# Patient Record
Sex: Female | Born: 1985 | Race: Black or African American | Hispanic: No | Marital: Single | Smoking: Never smoker
Health system: Southern US, Community
[De-identification: ages and names within clinical notes are randomized; demographics above are authoritative.]

## PROBLEM LIST (undated history)

## (undated) ENCOUNTER — Emergency Department (HOSPITAL_COMMUNITY): Payer: Self-pay | Source: Home / Self Care

## (undated) ENCOUNTER — Emergency Department (HOSPITAL_COMMUNITY): Discharge status: Absent without leave | Payer: Self-pay

## (undated) DIAGNOSIS — L309 Dermatitis, unspecified: Secondary | ICD-10-CM

---
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
## 2007-05-29 ENCOUNTER — Ambulatory Visit: Payer: Self-pay | Admitting: Internal Medicine

## 2007-05-29 ENCOUNTER — Encounter (INDEPENDENT_AMBULATORY_CARE_PROVIDER_SITE_OTHER): Payer: Self-pay | Admitting: Nurse Practitioner

## 2007-05-29 DIAGNOSIS — N946 Dysmenorrhea, unspecified: Secondary | ICD-10-CM | POA: Insufficient documentation

## 2007-05-29 DIAGNOSIS — F909 Attention-deficit hyperactivity disorder, unspecified type: Secondary | ICD-10-CM | POA: Insufficient documentation

## 2007-05-29 DIAGNOSIS — F410 Panic disorder [episodic paroxysmal anxiety] without agoraphobia: Secondary | ICD-10-CM | POA: Insufficient documentation

## 2007-07-10 ENCOUNTER — Encounter (INDEPENDENT_AMBULATORY_CARE_PROVIDER_SITE_OTHER): Payer: Self-pay | Admitting: Nurse Practitioner

## 2007-11-01 ENCOUNTER — Emergency Department (HOSPITAL_COMMUNITY): Admission: EM | Admit: 2007-11-01 | Discharge: 2007-11-02 | Payer: Self-pay | Admitting: Emergency Medicine

## 2007-11-04 ENCOUNTER — Ambulatory Visit: Payer: Self-pay | Admitting: Nurse Practitioner

## 2008-01-14 ENCOUNTER — Ambulatory Visit: Payer: Self-pay | Admitting: Nurse Practitioner

## 2008-01-14 LAB — CONVERTED CEMR LAB
ALT: <8 U/L
AST: 15 U/L
Albumin: 4.5 g/dL
Alkaline Phosphatase: 49 U/L
BUN: 8 mg/dL
Basophils Absolute: 0 K/uL
Basophils Relative: 0 %
CO2: 25 meq/L
Calcium: 10 mg/dL
Chloride: 106 meq/L
Creatinine, Ser: 0.79 mg/dL
Eosinophils Absolute: 0.2 K/uL
Eosinophils Relative: 2 %
Glucose, Bld: 83 mg/dL
HCT: 40.5 %
Hemoglobin: 13.4 g/dL
Lymphocytes Relative: 24 %
Lymphs Abs: 2.1 K/uL
MCHC: 33.1 g/dL
MCV: 85.3 fL
Monocytes Absolute: 0.6 K/uL
Monocytes Relative: 7 %
Neutro Abs: 5.7 K/uL
Neutrophils Relative %: 66 %
Platelets: 229 K/uL
Potassium: 4.6 meq/L
RBC: 4.75 M/uL
RDW: 12.2 %
Sodium: 140 meq/L
TSH: 0.294 u[IU]/mL — ABNORMAL LOW
Total Bilirubin: 0.3 mg/dL
Total Protein: 7.4 g/dL
WBC: 8.6 10*3/microliter

## 2008-01-15 ENCOUNTER — Encounter (INDEPENDENT_AMBULATORY_CARE_PROVIDER_SITE_OTHER): Payer: Self-pay | Admitting: Nurse Practitioner

## 2008-01-18 ENCOUNTER — Encounter (INDEPENDENT_AMBULATORY_CARE_PROVIDER_SITE_OTHER): Payer: Self-pay | Admitting: Nurse Practitioner

## 2008-01-18 DIAGNOSIS — E0590 Thyrotoxicosis, unspecified without thyrotoxic crisis or storm: Secondary | ICD-10-CM | POA: Insufficient documentation

## 2008-01-18 LAB — CONVERTED CEMR LAB
Free T4: 1.08 ng/dL
T3, Free: 2.9 pg/mL

## 2008-01-28 ENCOUNTER — Ambulatory Visit: Payer: Self-pay | Admitting: Nurse Practitioner

## 2008-01-28 DIAGNOSIS — N3000 Acute cystitis without hematuria: Secondary | ICD-10-CM | POA: Insufficient documentation

## 2008-01-28 LAB — CONVERTED CEMR LAB
Blood in Urine, dipstick: NEGATIVE
Protein, U semiquant: NEGATIVE
Urobilinogen, UA: 1

## 2008-01-29 ENCOUNTER — Encounter (INDEPENDENT_AMBULATORY_CARE_PROVIDER_SITE_OTHER): Payer: Self-pay | Admitting: Nurse Practitioner

## 2008-02-01 ENCOUNTER — Ambulatory Visit: Payer: Self-pay | Admitting: *Deleted

## 2008-02-02 ENCOUNTER — Ambulatory Visit (HOSPITAL_COMMUNITY): Admission: RE | Admit: 2008-02-02 | Discharge: 2008-02-02 | Payer: Self-pay | Admitting: Internal Medicine

## 2008-02-29 ENCOUNTER — Encounter (INDEPENDENT_AMBULATORY_CARE_PROVIDER_SITE_OTHER): Payer: Self-pay | Admitting: Nurse Practitioner

## 2008-03-02 ENCOUNTER — Ambulatory Visit: Payer: Self-pay | Admitting: Nurse Practitioner

## 2008-03-02 DIAGNOSIS — E041 Nontoxic single thyroid nodule: Secondary | ICD-10-CM | POA: Insufficient documentation

## 2008-03-02 DIAGNOSIS — L259 Unspecified contact dermatitis, unspecified cause: Secondary | ICD-10-CM | POA: Insufficient documentation

## 2008-03-02 LAB — CONVERTED CEMR LAB
Bilirubin Urine: NEGATIVE
Blood in Urine, dipstick: NEGATIVE
Glucose, Urine, Semiquant: NEGATIVE
KOH Prep: NEGATIVE
Nitrite: NEGATIVE
Protein, U semiquant: 30
Urobilinogen, UA: 0.2
pH: 7

## 2008-03-03 ENCOUNTER — Encounter (INDEPENDENT_AMBULATORY_CARE_PROVIDER_SITE_OTHER): Payer: Self-pay | Admitting: Nurse Practitioner

## 2008-03-03 DIAGNOSIS — A5601 Chlamydial cystitis and urethritis: Secondary | ICD-10-CM | POA: Insufficient documentation

## 2008-03-03 LAB — CONVERTED CEMR LAB: Chlamydia, DNA Probe: POSITIVE — AB

## 2008-03-04 DIAGNOSIS — B009 Herpesviral infection, unspecified: Secondary | ICD-10-CM | POA: Insufficient documentation

## 2008-03-07 ENCOUNTER — Encounter (HOSPITAL_COMMUNITY): Admission: RE | Admit: 2008-03-07 | Discharge: 2008-04-27 | Payer: Self-pay | Admitting: Family Medicine

## 2008-03-09 ENCOUNTER — Telehealth (INDEPENDENT_AMBULATORY_CARE_PROVIDER_SITE_OTHER): Payer: Self-pay | Admitting: Nurse Practitioner

## 2008-03-09 ENCOUNTER — Encounter (INDEPENDENT_AMBULATORY_CARE_PROVIDER_SITE_OTHER): Payer: Self-pay | Admitting: Nurse Practitioner

## 2008-03-18 ENCOUNTER — Encounter (INDEPENDENT_AMBULATORY_CARE_PROVIDER_SITE_OTHER): Payer: Self-pay | Admitting: *Deleted

## 2008-04-01 ENCOUNTER — Telehealth (INDEPENDENT_AMBULATORY_CARE_PROVIDER_SITE_OTHER): Payer: Self-pay | Admitting: Nurse Practitioner

## 2008-04-20 ENCOUNTER — Encounter (INDEPENDENT_AMBULATORY_CARE_PROVIDER_SITE_OTHER): Payer: Self-pay | Admitting: Nurse Practitioner

## 2008-04-21 ENCOUNTER — Encounter (INDEPENDENT_AMBULATORY_CARE_PROVIDER_SITE_OTHER): Payer: Self-pay | Admitting: Nurse Practitioner

## 2008-04-21 DIAGNOSIS — R5381 Other malaise: Secondary | ICD-10-CM | POA: Insufficient documentation

## 2008-04-21 DIAGNOSIS — R5383 Other fatigue: Secondary | ICD-10-CM

## 2008-05-18 ENCOUNTER — Encounter (INDEPENDENT_AMBULATORY_CARE_PROVIDER_SITE_OTHER): Payer: Self-pay | Admitting: Nurse Practitioner

## 2008-05-18 ENCOUNTER — Ambulatory Visit (HOSPITAL_BASED_OUTPATIENT_CLINIC_OR_DEPARTMENT_OTHER): Admission: RE | Admit: 2008-05-18 | Discharge: 2008-05-18 | Payer: Self-pay | Admitting: Nurse Practitioner

## 2008-05-21 ENCOUNTER — Ambulatory Visit: Payer: Self-pay | Admitting: Internal Medicine

## 2008-05-27 ENCOUNTER — Telehealth (INDEPENDENT_AMBULATORY_CARE_PROVIDER_SITE_OTHER): Payer: Self-pay | Admitting: Nurse Practitioner

## 2008-06-08 ENCOUNTER — Encounter (INDEPENDENT_AMBULATORY_CARE_PROVIDER_SITE_OTHER): Payer: Self-pay | Admitting: Nurse Practitioner

## 2008-06-14 ENCOUNTER — Encounter (INDEPENDENT_AMBULATORY_CARE_PROVIDER_SITE_OTHER): Payer: Self-pay | Admitting: *Deleted

## 2008-06-20 ENCOUNTER — Telehealth (INDEPENDENT_AMBULATORY_CARE_PROVIDER_SITE_OTHER): Payer: Self-pay | Admitting: Nurse Practitioner

## 2008-06-28 ENCOUNTER — Encounter (INDEPENDENT_AMBULATORY_CARE_PROVIDER_SITE_OTHER): Payer: Self-pay | Admitting: Nurse Practitioner

## 2008-06-28 ENCOUNTER — Ambulatory Visit (HOSPITAL_BASED_OUTPATIENT_CLINIC_OR_DEPARTMENT_OTHER): Admission: RE | Admit: 2008-06-28 | Discharge: 2008-06-28 | Payer: Self-pay | Admitting: Nurse Practitioner

## 2008-07-03 ENCOUNTER — Ambulatory Visit: Payer: Self-pay | Admitting: Internal Medicine

## 2008-07-04 ENCOUNTER — Telehealth (INDEPENDENT_AMBULATORY_CARE_PROVIDER_SITE_OTHER): Payer: Self-pay | Admitting: Nurse Practitioner

## 2008-07-05 ENCOUNTER — Encounter (INDEPENDENT_AMBULATORY_CARE_PROVIDER_SITE_OTHER): Payer: Self-pay | Admitting: Nurse Practitioner

## 2009-04-02 ENCOUNTER — Emergency Department (HOSPITAL_COMMUNITY): Admission: EM | Admit: 2009-04-02 | Discharge: 2009-04-02 | Payer: Self-pay | Admitting: Emergency Medicine

## 2009-07-14 ENCOUNTER — Encounter (INDEPENDENT_AMBULATORY_CARE_PROVIDER_SITE_OTHER): Payer: Self-pay | Admitting: Nurse Practitioner

## 2009-08-03 ENCOUNTER — Ambulatory Visit: Payer: Self-pay | Admitting: Nurse Practitioner

## 2009-08-03 DIAGNOSIS — F329 Major depressive disorder, single episode, unspecified: Secondary | ICD-10-CM | POA: Insufficient documentation

## 2009-08-03 LAB — CONVERTED CEMR LAB: Rapid HIV Screen: NEGATIVE

## 2009-08-08 ENCOUNTER — Encounter (INDEPENDENT_AMBULATORY_CARE_PROVIDER_SITE_OTHER): Payer: Self-pay | Admitting: Nurse Practitioner

## 2009-08-08 LAB — CONVERTED CEMR LAB
ALT: 8 units/L (ref 0–35)
Chloride: 104 meq/L (ref 96–112)
Cholesterol: 105 mg/dL (ref 0–200)
Glucose, Bld: 63 mg/dL — ABNORMAL LOW (ref 70–99)
LDL Cholesterol: 67 mg/dL (ref 0–99)
Lymphocytes Relative: 13 % (ref 12–46)
Lymphs Abs: 1.1 10*3/uL (ref 0.7–4.0)
MCHC: 33.9 g/dL (ref 30.0–36.0)
MCV: 83.6 fL (ref 78.0–100.0)
Neutro Abs: 6.3 10*3/uL (ref 1.7–7.7)
RBC: 4.69 M/uL (ref 3.87–5.11)
RDW: 12.4 % (ref 11.5–15.5)
Sodium: 140 meq/L (ref 135–145)
Total Protein: 6.7 g/dL (ref 6.0–8.3)
Triglycerides: 66 mg/dL (ref <150)
VLDL: 13 mg/dL (ref 0–40)
WBC: 8.4 10*3/uL (ref 4.0–10.5)

## 2009-08-10 ENCOUNTER — Encounter (INDEPENDENT_AMBULATORY_CARE_PROVIDER_SITE_OTHER): Payer: Self-pay | Admitting: Nurse Practitioner

## 2009-08-10 LAB — CONVERTED CEMR LAB: T3, Free: 3.1 pg/mL (ref 2.3–4.2)

## 2009-08-21 ENCOUNTER — Ambulatory Visit: Payer: Self-pay | Admitting: Nurse Practitioner

## 2009-08-21 ENCOUNTER — Encounter (HOSPITAL_COMMUNITY): Admission: RE | Admit: 2009-08-21 | Discharge: 2009-11-19 | Payer: Self-pay | Admitting: Internal Medicine

## 2009-08-23 ENCOUNTER — Ambulatory Visit: Payer: Self-pay | Admitting: Nurse Practitioner

## 2009-08-23 DIAGNOSIS — A599 Trichomoniasis, unspecified: Secondary | ICD-10-CM | POA: Insufficient documentation

## 2009-08-23 LAB — CONVERTED CEMR LAB
Nitrite: NEGATIVE
Specific Gravity, Urine: 1.02
Urobilinogen, UA: 0.2
pH: 7.5

## 2009-08-24 LAB — CONVERTED CEMR LAB: Chlamydia, DNA Probe: POSITIVE — AB

## 2009-08-28 ENCOUNTER — Encounter (INDEPENDENT_AMBULATORY_CARE_PROVIDER_SITE_OTHER): Payer: Self-pay | Admitting: Nurse Practitioner

## 2009-09-20 ENCOUNTER — Ambulatory Visit: Payer: Self-pay | Admitting: Nurse Practitioner

## 2010-05-03 ENCOUNTER — Ambulatory Visit: Payer: Self-pay | Admitting: Nurse Practitioner

## 2010-05-04 LAB — CONVERTED CEMR LAB
Eosinophils Absolute: 0.2 10*3/uL (ref 0.0–0.7)
Hemoglobin: 13.7 g/dL (ref 12.0–15.0)
Neutrophils Relative %: 68 % (ref 43–77)
T3, Free: 3 pg/mL (ref 2.3–4.2)
TSH: 0.298 microintl units/mL — ABNORMAL LOW (ref 0.350–4.500)
WBC: 7.7 10*3/uL (ref 4.0–10.5)

## 2010-05-25 ENCOUNTER — Telehealth (INDEPENDENT_AMBULATORY_CARE_PROVIDER_SITE_OTHER): Payer: Self-pay | Admitting: Nurse Practitioner

## 2010-07-06 ENCOUNTER — Ambulatory Visit: Admit: 2010-07-06 | Payer: Self-pay | Admitting: Nurse Practitioner

## 2010-07-16 ENCOUNTER — Encounter: Payer: Self-pay | Admitting: Family Medicine

## 2010-07-24 NOTE — Assessment & Plan Note (Signed)
Summary: Complete Physical Exam   Vital Signs:  Patient profile:   25 year old female Menstrual status:  regular LMP:     08/03/2009 Weight:      118.7 pounds Temp:     97.1 degrees F oral Pulse rate:   57 / minute Pulse rhythm:   regular Resp:     16 per minute BP sitting:   116 / 80  (left arm) Cuff size:   regular  Vitals Entered By: Levon Hedger (August 23, 2009 10:07 AM) CC: CPP, Depression Is Patient Diabetic? No Pain Assessment Patient in pain? no       Does patient need assistance? Functional Status Self care Ambulation Normal LMP (date): 08/03/2009 LMP - Character: normal     Menstrual Status regular Enter LMP: 08/03/2009 Last PAP Result NEGATIVE FOR INTRAEPITHELIAL LESIONS OR MALIGNANCY.   CC:  CPP and Depression.  History of Present Illness:  Pt into the office for complete physical exam  PAP - last done in 2009. normal at that time Menses - LMP 08/07/2009 Pt has been sexually active but she is not currently with a partner at this time. No current birth control  Mammogram - none Pt is adopted so unsure of family history of breast cancer  Tdap - last done over 10 years ago  Dentist - pt still has braces in place with multilpe missing brackets. she reports that brackets should have been removed last year.    Optho - used to wear glasses but she lost them.  wearing glasses since age 80  Hyperthyroidism  +anxiety +diarrhea +palpitations +weight loss +hair loss +menstrual irregularity (notes spotting during the month)  Depression History:      The patient comes in today for her second follow up visit for depression.  The patient denies a depressed mood most of the day and a diminished interest in her usual daily activities.  Positive alarm features for depression include significant weight loss.  However, she denies fatigue (loss of energy) and recurrent thoughts of death or suicide.        Psychosocial stress factors include major life  changes.  The patient denies that she feels like life is not worth living, denies that she wishes that she were dead, and denies that she has thought about ending her life.        Comments:  Pt has been to see Aquilla Solian as ordered. Pt has a better affect today.  Much more talkative.  Depression Treatment History:  Prior Medication Used:   Start Date: Assessment of Effect:   Comments:  Paxil (paroxetine)     --     some improvement     restarted   Habits & Providers  Alcohol-Tobacco-Diet     Alcohol drinks/day: 0     Tobacco Status: never  Exercise-Depression-Behavior     Does Patient Exercise: yes     Type of exercise: walking     Times/week: 5     Have you felt down or hopeless? yes     Have you felt little pleasure in things? yes     Depression Counseling: not indicated; screening negative for depression  Comments: PHQ 9 score 20  Medications Prior to Update: 1)  Paxil 20 Mg  Tabs (Paroxetine Hcl) .... One Tablet By Mouth Daily For Mood  Allergies (verified): No Known Drug Allergies  Review of Systems General:  Denies fever. Eyes:  Denies discharge. ENT:  Denies earache. CV:  Denies chest pain or discomfort.  Resp:  Denies cough. GI:  Denies abdominal pain, nausea, and vomiting. GU:  Denies discharge. MS:  Denies joint pain. Derm:  Denies rash. Neuro:  Denies headaches. Psych:  Complains of anxiety.  Physical Exam  General:  alert.   Head:  normocephalic.   Eyes:  dilated pupils pupils round and pupils reactive to light.   Ears:  R ear normal and L ear normal.  bil TM with bony landmarks right eyebrow ring Nose:  no nasal discharge.   Mouth:  pharynx pink and moist.  braces in place - some with missing brackets Neck:  supple.   Chest Wall:  no mass.   Breasts:  skin/areolae normal, no masses, and no abnormal thickening.   Lungs:  normal breath sounds.   Heart:  normal rate and regular rhythm.   Abdomen:  soft, non-tender, and normal bowel sounds.   flat Msk:  up to the exam table Pulses:  R radial normal and L radial normal.   Extremities:  no edema Neurologic:  gait normal.   Skin:  color normal.   Psych:  Oriented X3.    Pelvic Exam  Vulva:      normal appearance, normal hair distribution, no lesions or masses.   Urethra and Bladder:      Urethra--no discharge.   Vagina:      serous discharge Cervix:      midposition, friable.   Adnexa:      nontender bilaterally.      Impression & Recommendations:  Problem # 1:  ROUTINE GYNECOLOGICAL EXAMINATION (ICD-V72.31) PAP done labs reviewed from previous visit self breast exams encouraged rec optho and dental exam will need tdap when available PHQ-9 score = 20 Orders: UA Dipstick w/o Micro (manual) (45409) KOH/ WET Mount [id number]) Pap Smear, Thin Prep ( Collection of) (Q0091) T- GC Chlamydia (47829)  Problem # 2:  DEPRESSION (ICD-311) pt has been to see the LCSW as ordered she has restarted paxil Her updated medication list for this problem includes:    Paxil 20 Mg Tabs (Paroxetine hcl) ..... One tablet by mouth daily for mood  Problem # 3:  HYPERTHYROIDISM (ICD-242.90)  Thyroid uptake scan reviewed with pt advised pt that she needs meds for symtpoms handout given Her updated medication list for this problem includes:    Methimazole 5 Mg Tabs (Methimazole) ..... One tablet by mouth two times a day for thyroid  Problem # 4:  TRICHOMONAL INFECTION (ICD-131.9)  pt advised of dx partner will need treatment  Complete Medication List: 1)  Paxil 20 Mg Tabs (Paroxetine hcl) .... One tablet by mouth daily for mood 2)  Methimazole 5 Mg Tabs (Methimazole) .... One tablet by mouth two times a day for thyroid 3)  Metronidazole 500 Mg Tabs (Metronidazole) .... Four tablets by mouth x 1 dose  Patient Instructions: 1)  Your thyroid uptake scan is normal. 2)  But you are having symptoms from hyperthyroidism.  Read the handout. 3)  You should take the thyroid medications  twice daily. 4)  Keep your appointment with Aquilla Solian. 5)  continue paxil daily as ordered 6)  Follow up in 2 months for medication and labs. 7)  will review paxil and tsh. 8)  you will need tdap if available Prescriptions: METRONIDAZOLE 500 MG TABS (METRONIDAZOLE) Four tablets by mouth x 1 dose  #4 x 0   Entered and Authorized by:   Lehman Prom FNP   Signed by:   Lehman Prom FNP on 08/23/2009   Method  used:   Print then Give to Patient   RxID:   [id number] METHIMAZOLE 5 MG TABS (METHIMAZOLE) One tablet by mouth two times a day for thyroid  #60 x 3   Entered and Authorized by:   Lehman Prom FNP   Signed by:   Lehman Prom FNP on 08/23/2009   Method used:   Print then Give to Patient   RxID:   [id number]   Prevention & Chronic Care Immunizations   Influenza vaccine: refused  (08/03/2009)    Tetanus booster: Not documented   Td booster deferral: Not available  (08/23/2009)    Pneumococcal vaccine: Not documented  Other Screening   Pap smear: NEGATIVE FOR INTRAEPITHELIAL LESIONS OR MALIGNANCY.  (03/02/2008)   Pap smear action/deferral: Ordered  (08/23/2009)   Pap smear due: 08/24/2010   Smoking status: never  (08/23/2009)  Laboratory Results   Urine Tests  Date/Time Received: August 23, 2009 12:30 PM  Date/Time Reported: August 23, 2009 12:30 PM   Routine Urinalysis   Color: lt. yellow Appearance: Cloudy Glucose: negative   (Normal Range: Negative) Bilirubin: negative   (Normal Range: Negative) Ketone: negative   (Normal Range: Negative) Spec. Gravity: 1.020   (Normal Range: 1.003-1.035) Blood: trace-intact   (Normal Range: Negative) pH: 7.5   (Normal Range: 5.0-8.0) Protein: trace   (Normal Range: Negative) Urobilinogen: 0.2   (Normal Range: 0-1) Nitrite: negative   (Normal Range: Negative) Leukocyte Esterace: small   (Normal Range: Negative)      Wet Mount Source: vaginal WBC/hpf: 1-5 Bacteria/hpf: rare Clue cells/hpf:  none Yeast/hpf: none Wet Mount KOH: Negative Trichomonas/hpf: moderate    Laboratory Results   Urine Tests    Routine Urinalysis   Color: lt. yellow Appearance: Cloudy Glucose: negative   (Normal Range: Negative) Bilirubin: negative   (Normal Range: Negative) Ketone: negative   (Normal Range: Negative) Spec. Gravity: 1.020   (Normal Range: 1.003-1.035) Blood: trace-intact   (Normal Range: Negative) pH: 7.5   (Normal Range: 5.0-8.0) Protein: trace   (Normal Range: Negative) Urobilinogen: 0.2   (Normal Range: 0-1) Nitrite: negative   (Normal Range: Negative) Leukocyte Esterace: small   (Normal Range: Negative)      Wet Mount/KOH

## 2010-07-24 NOTE — Letter (Signed)
Summary: Handout Printed  Printed Handout:  - Trichomonas 

## 2010-07-24 NOTE — Letter (Signed)
Summary: External Othe/rDIVISION OF MOTOR VEHICLES  External [employer]the/r[employer] OF [employer] VEHICLES   Imported By: Arta Bruce 08/03/2009 14:11:35  _____________________________________________________________________  External Attachment:    Type:   Image     Comment:   External Document

## 2010-07-24 NOTE — Progress Notes (Signed)
Summary: Office Visit/DEPRESSION SCREENING  Office Visit/DEPRESSION SCREENING   Imported By: Arta Bruce 09/25/2009 16:23:31  _____________________________________________________________________  External Attachment:    Type:   Image     Comment:   External Document

## 2010-07-24 NOTE — Letter (Signed)
Summary: Out of Work  [facility]  [address] [place], [place] [address]   Phone: [phone]  Fax: [phone]    September 20, 2009   Employee:  Rachel Benjamin    To Whom It May Concern:   For Medical reasons, please excuse the above named employee from work for the following dates:  Start:   March 30th, 2011 (seen in office today)    End:   April 1st, 2011 (may return to work)  If you need additional information, please feel free to contact our office.         Sincerely,    Lehman Prom FNP

## 2010-07-24 NOTE — Progress Notes (Signed)
Summary: Office Visit//DEPRESSION SCREENING  Office Visit//DEPRESSION SCREENING   Imported By: Arta Bruce 10/19/2009 15:53:31  _____________________________________________________________________  External Attachment:    Type:   Image     Comment:   External Document

## 2010-07-24 NOTE — Letter (Signed)
Summary: COMMUNICABLE DISEASE REPORT  COMMUNICABLE DISEASE REPORT   Imported By: Arta Bruce 10/25/2009 12:18:46  _____________________________________________________________________  External Attachment:    Type:   Image     Comment:   External Document

## 2010-07-24 NOTE — Letter (Signed)
Summary: Handout Printed  Printed Handout:  - Hyperthyroidism 

## 2010-07-24 NOTE — Assessment & Plan Note (Signed)
Summary: Acute - Follulitis   Vital Signs:  Patient profile:   25 year old female Menstrual status:  regular LMP:     08/2009 Weight:      124.2 pounds BMI:     23.17 BSA:     1.55 Temp:     97.9 degrees F oral Pulse rate:   81 / minute Pulse rhythm:   regular Resp:     20 per minute BP sitting:   114 / 75  (left arm) Cuff size:   regular  Vitals Entered By: Levon Hedger (September 20, 2009 2:16 PM) CC: swelling on left eye that just happened over night Is Patient Diabetic? No Pain Assessment Patient in pain? yes     Location: head  Does patient need assistance? Functional Status Self care Ambulation Normal LMP (date): 08/2009 LMP - Character: normal     Enter LMP: 08/2009 Last PAP Result  Specimen Adequacy: Satisfactory for evaluation.   Interpretation/Result:Negative for intraepithelial Lesion or Malignancy.   Interpretation/Result:Trichomonas Vaginalis present.       CC:  swelling on left eye that just happened over night.  History of Present Illness:  Pt into the office with left eye problems. On yesterday she started with a "pimple" on her left eyebrow. When she woke this morning the area on her left eyebrow had tripled in size, eye was swollen. Eyebrows also done 2 weeks ago.  Shaved and shaped at a local nail shop. unable to open eye completely. +headache  Eyelashes in place for the past 2 weeks. Place by P[place]. She has had false eyelashes previously but she has never has this problem before  Hyperthyroidism - pt has not started taking the medications as advised "I am not going to take that medication cause it will make me get big"  Allergies (verified): No Known Drug Allergies  Review of Systems Eyes:  left swollen eye. Derm:  Complains of lesion(s); left eyebrow. Neuro:  Complains of headaches.  Physical Exam  General:  alert.   Head:  normocephalic.   Eyes:  left eye lid is swollen, pupil is round and reactive  Skin:  left eyeborow with  erythema, tender, raised approx 1cm   Impression & Recommendations:  Problem # 1:  FOLLICULITIS (ICD-704.8) advised pt to keep area clean apply warm compresses and ointment keep hands out of the area take antibiotics  Complete Medication List: 1)  Paxil 20 Mg Tabs (Paroxetine hcl) .... One tablet by mouth daily for mood 2)  Methimazole 5 Mg Tabs (Methimazole) .... One tablet by mouth two times a day for thyroid 3)  Cephalexin 500 Mg Caps (Cephalexin) .... One capsule by mouth three times a day for infection  Patient Instructions: 1)  Apply warm compresses to left forehead and eye for at least 15 minutes and then stop. 2)  Apply ointment to area three times a day  3)  Swelling should get better by the end of the week.  however keep taking the medications. Prescriptions: CEPHALEXIN 500 MG CAPS (CEPHALEXIN) One capsule by mouth three times a day for infection  #30 x 0   Entered and Authorized by:   Lehman Prom FNP   Signed by:   Lehman Prom FNP on 09/20/2009   Method used:   Print then Give to Patient   RxID:   [id number]

## 2010-07-24 NOTE — Progress Notes (Signed)
Summary: NEED TO SPEAK WITH THE NURSE  Phone Note Call from Patient   Summary of Call: PLEASE CALL PATIENT NEED TO SPEAK WITH YOU  CALL HER AT [phone] ABOUT MEDICINE FOR TTHYROID Initial call taken by: Domenic Polite,  May 25, 2010 9:24 AM  Follow-up for Phone Call        PT CALLED BECAUSE SHE STARTED MEDICATION ON 12/1 TO SCHEDULE LAB VISIT FOR 6-8 WEEKS AFTER STARTING MEDS.  APPT SCHEDULED FOR 07/05/10 @ 9:30AM Follow-up by: Levon Hedger,  May 25, 2010 9:39 AM

## 2010-07-24 NOTE — Letter (Signed)
Summary: TEST ORDER FORM//NUCLEAR MEDICINE  TEST ORDER FORM//NUCLEAR MEDICINE   Imported By: Arta Bruce 10/03/2009 12:25:32  _____________________________________________________________________  External Attachment:    Type:   Image     Comment:   External Document

## 2010-07-24 NOTE — Assessment & Plan Note (Signed)
Summary: Hyperthyroidism   Vital Signs:  Patient profile:   25 year old female Menstrual status:  regular LMP:     04/2010 Weight:      126.0 pounds BMI:     23.51 Temp:     97.6 degrees F oral Pulse rate:   80 / minute Pulse rhythm:   regular Resp:     20 per minute BP sitting:   100 / 60  (left arm) Cuff size:   regular  Vitals Entered By: Levon Hedger (May 03, 2010 11:52 AM) CC: needs to have thyroid checked, Depression Is Patient Diabetic? No Pain Assessment Patient in pain? no       Does patient need assistance? Functional Status Self care Ambulation Normal LMP (date): 04/2010 LMP - Character: normal     Enter LMP: 04/2010 Last PAP Result  Specimen Adequacy: Satisfactory for evaluation.   Interpretation/Result:Negative for intraepithelial Lesion or Malignancy.   Interpretation/Result:Trichomonas Vaginalis present.       CC:  needs to have thyroid checked and Depression.  History of Present Illness:  Pt into the office for f/u hyperthyroidism Pt has not taken her medications in over 6 months. States that when she gets sick with cough or cold that she has these areas in her throat that are tender. Once she improves then the areas go away.  C/o rash to upper chest - Hx of eczema Present also umbilical area and behind her knees Stopped wearing necklaces because they made the areas worse. She was told in the past that she was allergic to nickel  The patient denies that she feels like life is not worth living, denies that she wishes that she were dead, and denies that she has thought about ending her life.         Depression Treatment History:  Prior Medication Used:   Start Date: Assessment of Effect:   Comments:  Paxil (paroxetine)     --     some improvement     pt stopped Zoloft (sertraline)     05/03/2010     --       started  Allergies (verified): No Known Drug Allergies  Review of Systems General:  Denies fever; Braces removed last  week. CV:  Denies chest pain or discomfort. Resp:  Denies cough. GI:  Denies abdominal pain, nausea, and vomiting. Psych:  Complains of depression; still grieving the loss of both her parents.  Physical Exam  General:  alert.   Head:  normocephalic.   Lungs:  normal breath sounds.   Heart:  normal rate.   Abdomen:  normal bowel sounds.   Neurologic:  alert & oriented X3.     Impression & Recommendations:  Problem # 1:  HYPERTHYROIDISM (ICD-242.90) will check labs today pt is still resistant to take meds but am still trying to reinforce that with pt The following medications were removed from the medication list:    Methimazole 5 Mg Tabs (Methimazole) ..... One tablet by mouth two times a day for thyroid  Orders: T-TSH ([id number]) T-T3, Free [phone]) T-Free T4 (23300) T-CBC w/Diff ([id number])  Problem # 2:  DEPRESSION (ICD-311) Pt would like to start something different than paxil since she has been taking that for many years The following medications were removed from the medication list:    Paxil 20 Mg Tabs (Paroxetine hcl) ..... One tablet by mouth daily for mood Her updated medication list for this problem includes:    Sertraline Hcl 50 Mg Tabs (Sertraline hcl) .Marland KitchenMarland KitchenMarland KitchenMarland Kitchen  One tablet by mouth nightly for mood  Orders: T-TSH ([id number]) T-T3, Free [phone]) T-Free T4 (23300)  Problem # 3:  ECZEMA (ICD-692.9) advised pt to avoid metal since she knows she is allergic Her updated medication list for this problem includes:    Diprolene 0.05 % Oint (Betamethasone dipropionate aug) .Marland Kitchen... Apply to affected area two times a day tdap given today  Complete Medication List: 1)  Diprolene 0.05 % Oint (Betamethasone dipropionate aug) .... Apply to affected area two times a day 2)  Sertraline Hcl 50 Mg Tabs (Sertraline hcl) .... One tablet by mouth nightly for mood  Other Orders: Tdap => 23yrs IM (51884) Admin 1st Vaccine (16606)   Patient Instructions: 1)  Your  thyroid will be checked today. 2)  You will be notified of the results 3)  You have received your tetanus today. 4)  Eczema - Refill on cream has been sent to the pharmacy.  Apply to affected area.  Avoid nickel to your neck (necklaces) and also belt buckles which make eczema worse 5)  Depression - Start sertralazine 50mg  by mouth nightly (Prescription [facility] to [facility])  This will take several weeks to build up in your system 6)  The tender areas that you feel at the back of your neck and throat are lymph nodes.  This is your body's immune system at work.  You may notice these if you are sick with allergies, sinus problems or cold symptoms.  Once you get over these illness then the areas probably go away. 7)  Follow up as needed Prescriptions: SERTRALINE HCL 50 MG TABS (SERTRALINE HCL) One tablet by mouth nightly for mood  #30 x 5   Entered and Authorized by:   Lehman Prom FNP   Signed by:   Lehman Prom FNP on 05/03/2010   Method used:   Faxed to ...       [facility] (retail)       [address] [place], [place]  [address]       Ph: [phone]       Fax: [phone]   RxID:   [id number] DIPROLENE 0.05 % OINT (BETAMETHASONE DIPROPIONATE AUG) Apply to affected area two times a day  #16 ounces x 1   Entered and Authorized by:   Lehman Prom FNP   Signed by:   Lehman Prom FNP on 05/03/2010   Method used:   Faxed to ...       [facility] (retail)       [address] [place], [place]  [address]       Ph: [phone]       Fax: [phone]   RxID:   [id number]    Orders Added: 1)  Est. Patient Level III [99213] 2)  T-TSH [02585-27782] 3)  T-T3, Free [42353-61443] 4)  T-Free T4 [23300] 5)  T-CBC w/Diff [15400-86761] 6)  Tdap => 35yrs IM [90715] 7)  Admin 1st Vaccine [95093]   Immunizations Administered:  Tetanus Vaccine:    Vaccine Type: Tdap    Site: right  deltoid    Mfr: Sanofi Pasteur    Dose: 0.5 ml    Route: IM    Given by: Levon Hedger    Exp. Date: 07/11/2012    Lot #: [id number]    VIS given: 05/11/08 version given May 03, 2010.   Immunizations Administered:  Tetanus Vaccine:  Vaccine Type: Tdap    Site: right deltoid    Mfr: Sanofi Pasteur    Dose: 0.5 ml    Route: IM    Given by: Levon Hedger    Exp. Date: 07/11/2012    Lot #: [id number]    VIS given: 05/11/08 version given May 03, 2010.  Last LDL:                                                 67 (08/03/2009 9:44:00 PM)      Prevention & Chronic Care Immunizations   Influenza vaccine: refused  (08/03/2009)   Influenza vaccine deferral: Refused  (05/03/2010)    Tetanus booster: 05/03/2010: Tdap   Td booster deferral: Not available  (08/23/2009)    Pneumococcal vaccine: Not documented  Other Screening   Pap smear:  Specimen Adequacy: Satisfactory for evaluation.   Interpretation/Result:Negative for intraepithelial Lesion or Malignancy.   Interpretation/Result:Trichomonas Vaginalis present.      (08/23/2009)   Pap smear action/deferral: Ordered  (08/23/2009)   Pap smear due: 08/2010   Smoking status: never  (08/23/2009)   Nursing Instructions: Give tetanus booster today
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
## 2010-07-27 NOTE — Letter (Signed)
Summary: TEST ORDER FORM//NUCLEAR MEDICINE//APPT DATE & TIME  TEST ORDER FORM//NUCLEAR MEDICINE//APPT DATE & TIME   Imported By: Arta Bruce 10/03/2009 12:16:41  _____________________________________________________________________  External Attachment:    Type:   Image     Comment:   External Document

## 2010-08-09 NOTE — Letter (Signed)
Summary: SOCIAL WORK//NO SHOW  SOCIAL WORK//NO SHOW   Imported By: Arta Bruce 07/31/2010 10:19:03  _____________________________________________________________________  External Attachment:    Type:   Image     Comment:   External Document

## 2010-11-06 NOTE — Procedures (Signed)
NAME:  Rachel Benjamin, Rachel Benjamin NO.:  [id number]   MEDICAL RECORD NO.:  [id number]          PATIENT TYPE:  OUT   LOCATION:  SLEEP CENTER                 FACILITY:  [facility]   PHYSICIAN:  Clinton D. Maple Hudson, MD, FCCP, FACPDATE OF BIRTH:  05/28/87   DATE OF STUDY:  05/18/2008                            NOCTURNAL POLYSOMNOGRAM   REFERRING PHYSICIAN:   REFERRING PRACTITIONER:  Lehman Prom NP.   INDICATION FOR STUDY:  Hypersomnia with sleep apnea.   EPWORTH SLEEPINESS SCORE:  Epworth sleepiness score 11/24. BMI 22.3.  Weight 118 pounds. Height 61 inches. Neck 13 inches.   MEDICATIONS:  Home medications charted and reviewed.   SLEEP ARCHITECTURE:  Total sleep time 363 minutes with sleep efficiency  84.8%.  Stage I was 4.4%.  Stage II is 64.5%.  Stage III 3.6%.  REM  27.5% of total sleep time.  Sleep latency is 25 minutes. REM latency 62  minutes.  Awake after sleep onset 39 minutes.  Arousal index 19.3.  No  bedtime medication was taken.   RESPIRATORY DATA:  Apnea-hypopnea index (AHI) 0.7 per hour.  A total of  4 events were scored, all obstructive apneas associated with supine  sleep position and REM.  There were insufficient events to permit CPAP  titration by split protocol on the study night.   OXYGEN DATA:  Mild-to-moderate snoring with oxygen desaturation to a  nadir of 93%.  Mean oxygen saturation through the study was 95.8% on  room air.   CARDIAC DATA:  Normal sinus rhythm.   MOVEMENT/PARASOMNIA:  No significant movement disturbance.  Bathroom x1.   IMPRESSIONS/RECOMMENDATIONS:  1. Sleep architecture significant for relatively high percentage rapid      eye movement.  2. Occasional respiratory event affecting sleep, apnea-hypopnea index      0.7 per hour (normal range 0-5 per hour).  Events were associated      with supine sleep while in rapid eye movement.  Mild-to-moderate      snoring with oxygen desaturation to a nadir of 93%.  There were  insufficient events to permit continuous positive airway pressure      titration by split protocol on the study night.  3. Increased daytime sleepiness and increased percentage rapid eye      movement may be explained by inadequate sleep hygiene and      insufficient sleep.  These findings may also be seen in disorders      of primary      hypersomnolence such as narcolepsy or idiopathic.  If a primary      sleep disorder is suspected, consider return for a daytime multiple      sleep latency test to objectively assess for hypersomnia.      Clinton D. Maple Hudson, MD, [facility], FACP  Diplomate, Biomedical engineer of Sleep Medicine  Electronically Signed     CDY/MEDQ  D:  05/21/2008 10:48:18  T:  05/22/2008 00:10:25  Job:  [id number]

## 2010-11-09 NOTE — Procedures (Signed)
NAME:  Rachel Benjamin, Rachel Benjamin                ACCOUNT NO.:  [id number]   MEDICAL RECORD NO.:  [id number]          PATIENT TYPE:  OUT   LOCATION:  SLEEP CENTER                 FACILITY:  [facility]   PHYSICIAN:  Clinton D. Maple Hudson, MD, FCCP, FACPDATE OF BIRTH:  10/12/1986   DATE OF STUDY:                          MULTIPLE SLEEP LATENCY TEST   REFERRING PHYSICIAN:   MEAN SLEEP LATENCY:  </MEAN SLEEP LATENCY/>   NUMBER OF REM EPISODES:  </NUMBER OF REM EPISODES/>   COMMENTS:  </COMMENTS/>   IMPRESSIONS-RECOMMENDATIONS:  </IMPRESSIONS-RECOMMENDATIONS/>  This is a multiple sleep latency test report.   REFERRING PHYSICIAN:  Nurse Practitioner, Shirlean Schlein.   DATE OF THE STUDY:  June 28, 2008.   INDICATION FOR STUDY:  Hypersomnia with question of narcolepsy.   EPWORTH SLEEPINESS SCORE:  Epworth sleepiness score 9/24.  BMI 22.3.  Weight 118 pounds.  Age 25 years.  Neck 13 inches.   MEDICATIONS:  Home medication was Paxil.  No medication was taken on the  day of the study.  A baseline diagnostic study on May 18, 2008 had  recorded an AHI of 0.7 per hour with a total sleep time of 363 minutes,  including 27.5%, REM.  A multiple sleep latency test is now requested to  evaluate for primary hypersomnia such as narcolepsy.   SLEEP ARCHITECTURE:  Nap #1 8:00 a.m., sleep latency 10 minutes, REM  latency NA.  Nap #2 10:00 a.m., sleep latency 4.5 minutes, REM latency  NA.  Nap #3 12:00 noon, sleep latency 4.5 minutes, REM latency NA.  Nap  #4 at 1400 hours, sleep latency 20 minutes, REM latency NA.  Nap #5 1600  hours, sleep latency 20 minutes, REM latency NA.  Mean sleep latency  11.8 minutes.  Technician reported the patient had a difficult time  staying awake between naps.   IMPRESSION/RECOMMENDATIONS:  Mean sleep latency longer than 10 minutes  is considered potentially normal and a score of 11.8 minutes would be  regarded as sleepy, but not definitely pathologic, especially noting  absence  of REM during any nap and the inability to sleep afternoon on  nap attempts.  The first question would be adequacy of sleep hygiene,  home sleep environment, and sleep schedule as well as question of use of  any potentially  sedating medication not reported.  She may still benefit from gentle use  of alerting medications, but should be educated as to responsibility for  safe driving and potential benefit of an occasional short nap.      Clinton D. Maple Hudson, MD, [facility], FACP  Diplomate, Biomedical engineer of Sleep Medicine  Electronically Signed     CDY/MEDQ  D:  07/02/2008 11:35:55  T:  07/02/2008 04:54:09  Job:  [id number]

## 2011-04-04 ENCOUNTER — Emergency Department (HOSPITAL_COMMUNITY)
Admission: EM | Admit: 2011-04-04 | Discharge: 2011-04-05 | Disposition: A | Discharge status: Home / Self Care | Payer: Self-pay | Attending: Emergency Medicine | Admitting: Emergency Medicine

## 2011-04-04 DIAGNOSIS — K089 Disorder of teeth and supporting structures, unspecified: Secondary | ICD-10-CM | POA: Insufficient documentation

## 2011-06-22 ENCOUNTER — Encounter: Payer: Self-pay | Admitting: Emergency Medicine

## 2011-06-22 ENCOUNTER — Emergency Department (HOSPITAL_COMMUNITY)
Admission: EM | Admit: 2011-06-22 | Discharge: 2011-06-22 | Disposition: A | Discharge status: Home / Self Care | Payer: Self-pay | Attending: Emergency Medicine | Admitting: Emergency Medicine

## 2011-06-22 DIAGNOSIS — K029 Dental caries, unspecified: Secondary | ICD-10-CM | POA: Insufficient documentation

## 2011-06-22 DIAGNOSIS — K089 Disorder of teeth and supporting structures, unspecified: Secondary | ICD-10-CM | POA: Insufficient documentation

## 2011-06-22 MED ORDER — HYDROCODONE-ACETAMINOPHEN 5-500 MG PO TABS: 1.0000 | ORAL_TABLET | Freq: Four times a day (QID) | ORAL | Status: AC | PRN

## 2011-06-22 MED ORDER — PENICILLIN V POTASSIUM 500 MG PO TABS: 500.0000 mg | ORAL_TABLET | Freq: Four times a day (QID) | ORAL | Status: AC

## 2011-06-22 MED ORDER — OXYCODONE-ACETAMINOPHEN 5-325 MG PO TABS
1.0000 | ORAL_TABLET | Freq: Once | ORAL | Status: AC
  Administered 2011-06-22: 1 via ORAL
  Filled 2011-06-22: qty 1

## 2011-06-22 NOTE — ED Notes (Signed)
PT. REPORTS RIGHT LOWER MOLAR PAIN ONSET THIS EVENING UNRELIEVED BY OTC PAIN MEDICATIONS.

## 2011-06-22 NOTE — ED Notes (Signed)
Alert, NAD, calm interactive.

## 2011-06-22 NOTE — ED Provider Notes (Signed)
History     CSN: [id number]  Arrival date & time 06/22/11  0052   First MD Initiated Contact with Patient 06/22/11 0259      Chief Complaint  Patient presents with  . Dental Pain    (Consider location/radiation/quality/duration/timing/severity/associated sxs/prior treatment) Patient is a 25 y.o. female presenting with tooth pain. The history is provided by the patient. No language interpreter was used.  Dental PainThe primary symptoms include dental injury. Primary symptoms do not include oral bleeding, oral lesions, headaches, fever, shortness of breath, sore throat, angioedema or cough. The symptoms began 12 to 24 hours ago. The symptoms are worsening. The symptoms are new. The symptoms occur constantly.  Additional symptoms include: dental sensitivity to temperature and gum tenderness. Additional symptoms do not include: trouble swallowing, ear pain, hearing loss and nosebleeds. Medical issues do not include: immunosuppression and cancer.    History reviewed. No pertinent past medical history.  History reviewed. No pertinent past surgical history.  History reviewed. No pertinent family history.  History  Substance Use Topics  . Smoking status: Never Smoker   . Smokeless tobacco: Not on file  . Alcohol Use: Yes     OCCASIONAL    OB History    Grav Para Term Preterm Abortions TAB SAB Ect Mult Living                  Review of Systems  Constitutional: Negative for fever.  HENT: Negative for hearing loss, ear pain, nosebleeds, sore throat and trouble swallowing.   Eyes: Negative for discharge.  Respiratory: Negative for cough and shortness of breath.   Cardiovascular: Negative.   Gastrointestinal: Negative.   Musculoskeletal: Negative.   Neurological: Negative for headaches.  Hematological: Negative.   Psychiatric/Behavioral: Negative.     Allergies  Review of patient's allergies indicates no known allergies.  Home Medications   Current Outpatient Rx  Name  Route Sig Dispense Refill  . ACETAMINOPHEN 500 MG PO TABS Oral Take 1,000 mg by mouth every 6 (six) hours as needed. toothache     . IBUPROFEN 200 MG PO TABS Oral Take 400 mg by mouth every 6 (six) hours as needed. Toothache      . HYDROCODONE-ACETAMINOPHEN 5-500 MG PO TABS Oral Take 1 tablet by mouth every 6 (six) hours as needed for pain. 10 tablet 0  . PENICILLIN V POTASSIUM 500 MG PO TABS Oral Take 1 tablet (500 mg total) by mouth 4 (four) times daily. 28 tablet 0    BP 124/81  Pulse 74  Temp(Src) 98.2 F (36.8 C) (Oral)  Resp 17  SpO2 100%  LMP 06/13/2011  Physical Exam  Constitutional: She is oriented to person, place, and time. She appears well-developed and well-nourished. No distress.  HENT:  Head: Normocephalic and atraumatic.  Mouth/Throat: Uvula is midline. Dental caries present. No oropharyngeal exudate.    Eyes: Conjunctivae are normal. Pupils are equal, round, and reactive to light.  Neck: Normal range of motion. Neck supple.  Cardiovascular: Normal rate and regular rhythm.   Pulmonary/Chest: Effort normal and breath sounds normal.  Abdominal: Soft. Bowel sounds are normal. There is no tenderness.  Neurological: She is alert and oriented to person, place, and time.  Skin: Skin is warm and dry.  Psychiatric: Thought content normal.    ED Course  Procedures (including critical care time)  Labs Reviewed - No data to display No results found.   1. Dental caries       MDM  Take all antibiotics use  barrier contraception while on antibiotics and follow up with your own dentist       Kalif Kattner K Ryden Wainer-Rasch, MD 06/22/11 [phone]

## 2011-09-15 ENCOUNTER — Emergency Department (HOSPITAL_COMMUNITY)
Admission: EM | Admit: 2011-09-15 | Discharge: 2011-09-15 | Disposition: A | Discharge status: Home / Self Care | Payer: Self-pay | Attending: Emergency Medicine | Admitting: Emergency Medicine

## 2011-09-15 ENCOUNTER — Encounter (HOSPITAL_COMMUNITY): Payer: Self-pay | Admitting: *Deleted

## 2011-09-15 DIAGNOSIS — Z0389 Encounter for observation for other suspected diseases and conditions ruled out: Secondary | ICD-10-CM | POA: Insufficient documentation

## 2011-09-15 HISTORY — DX: Dermatitis, unspecified: L30.9

## 2011-09-15 NOTE — ED Notes (Signed)
Pt called x2 no response

## 2011-09-25 ENCOUNTER — Emergency Department (HOSPITAL_COMMUNITY)
Admission: EM | Admit: 2011-09-25 | Discharge: 2011-09-25 | Disposition: A | Discharge status: Home / Self Care | Payer: Self-pay | Attending: Emergency Medicine | Admitting: Emergency Medicine

## 2011-09-25 ENCOUNTER — Encounter (HOSPITAL_COMMUNITY): Payer: Self-pay | Admitting: Emergency Medicine

## 2011-09-25 DIAGNOSIS — L2989 Other pruritus: Secondary | ICD-10-CM | POA: Insufficient documentation

## 2011-09-25 DIAGNOSIS — L298 Other pruritus: Secondary | ICD-10-CM | POA: Insufficient documentation

## 2011-09-25 DIAGNOSIS — R21 Rash and other nonspecific skin eruption: Secondary | ICD-10-CM | POA: Insufficient documentation

## 2011-09-25 MED ORDER — PREDNISONE 20 MG PO TABS: 40.0000 mg | ORAL_TABLET | Freq: Every day | ORAL | Status: AC

## 2011-09-25 MED ORDER — PREDNISONE 20 MG PO TABS
40.0000 mg | ORAL_TABLET | Freq: Once | ORAL | Status: AC
  Administered 2011-09-25: 40 mg via ORAL
  Filled 2011-09-25: qty 2

## 2011-09-25 MED ORDER — HYDROXYZINE HCL 25 MG PO TABS: 25.0000 mg | ORAL_TABLET | Freq: Four times a day (QID) | ORAL | Status: AC

## 2011-09-25 MED ORDER — DIPHENHYDRAMINE HCL 25 MG PO CAPS
25.0000 mg | ORAL_CAPSULE | Freq: Once | ORAL | Status: AC
  Administered 2011-09-25: 25 mg via ORAL
  Filled 2011-09-25: qty 1

## 2011-09-25 NOTE — ED Notes (Signed)
PT. REPORTS GENERALIZED ITCHY RASHES FOR 3 DAYS , RESPIRATIONS UNLABORED , AIRWAY INTACT , STATES HISTORY OF ECZEMA .

## 2011-09-25 NOTE — Discharge Instructions (Signed)
Rash A rash is a change in the color or feel of your skin. There are many different types of rashes. You may have other problems along with your rash. HOME CARE  Avoid the thing that caused your rash.   Do not scratch your rash.   You may take cools baths to help stop itching.   Only take medicines as told by your doctor.   Keep all doctor visits as told.  GET HELP RIGHT AWAY IF:   Your pain, puffiness (swelling), or redness gets worse.   You have a fever.   You have new or severe problems.   You have body aches, watery poop (diarrhea), or you throw up (vomit).   Your rash is not better after 3 days.  MAKE SURE YOU:   Understand these instructions.   Will watch your condition.   Will get help right away if you are not doing well or get worse.  Document Released: 11/27/2007 Document Revised: 05/30/2011 Document Reviewed: 03/25/2011 [facility] Patient Information 2012 [place], [place].  RESOURCE GUIDE  Dental Problems  Patients with Medicaid: [facility] [phone]                                           [phone] [address] Phone:  [phone]                                                  Phone:  [phone]  If unable to pay or uninsured, contact:  [facility] or [facility]. to become qualified for the adult dental clinic.  Chronic Pain Problems Contact [facility]  [phone] Patients need to be referred by their primary care doctor.  Insufficient Money for Medicine Contact [employer] Way:  call "211" or [facility] [phone].  No Primary Care Doctor Call [facility]  [phone] Other agencies that provide inexpensive medical care    [facility]  [phone]    [facility]  [phone]    [facility]  [phone]    [facility]  [phone]    [facility]  [phone]    [facility]  [phone]  Psychological  Services [facility]  [phone] [facility] Services  [phone] [facility]   [phone] (emergency services [phone])  Substance Abuse Resources [facility]  ([phone] [facility] 587-222-7485 [facility] [place] [phone] Floydene Flock [phone] Residential & Outpatient Substance Abuse Program  [phone]  Abuse/Neglect [facility] Child Abuse Hotline [phone] [facility] Child Abuse Hotline 661-867-3923 (After Hours)  Emergency Shelter [facility] [phone]  Maternity [facility] [place] [facility] [place] 6107090434 [place] Alert Services [phone]  MRSA Hotline #:   212 225 7600    [facility] Resources  [facility] [place]     [employer]                          [facility] Dept. [address] [place] Phone:  [phone]                                   Phone:  [phone]                 Phone:  [place] Phone:  [place] [phone] (After Hours)

## 2011-10-03 NOTE — ED Provider Notes (Signed)
History    25yF with rash. Gradual onset about 3d ago. Things started on thighs. Progressed to generalized. Itches. No fever or chills. No n/v. No facial swelling. No difficulty breathing or swallowing. No contacts with similar. No new exposures that pt is aware of.   CSN: [id number]  Arrival date & time 09/25/11  0110   First MD Initiated Contact with Patient 09/25/11 0201      Chief Complaint  Patient presents with  . Allergic Reaction    (Consider location/radiation/quality/duration/timing/severity/associated sxs/prior treatment) HPI  Past Medical History  Diagnosis Date  . Eczema     History reviewed. No pertinent past surgical history.  No family history on file.  History  Substance Use Topics  . Smoking status: Never Smoker   . Smokeless tobacco: Not on file  . Alcohol Use: Yes     OCCASIONAL    OB History    Grav Para Term Preterm Abortions TAB SAB Ect Mult Living                  Review of Systems   Review of symptoms negative unless otherwise noted in HPI.   Allergies  Bee and Nickel  Home Medications   Current Outpatient Rx  Name Route Sig Dispense Refill  . ACETAMINOPHEN 500 MG PO TABS Oral Take 1,000 mg by mouth every 6 (six) hours as needed. toothache     . HYDROXYZINE HCL 25 MG PO TABS Oral Take 1 tablet (25 mg total) by mouth every 6 (six) hours. 15 tablet 0  . PREDNISONE 20 MG PO TABS Oral Take 2 tablets (40 mg total) by mouth daily. 10 tablet 0    BP 115/71  Pulse 64  Temp(Src) 97.8 F (36.6 C) (Oral)  Resp 16  SpO2 98%  LMP 09/22/2011  Physical Exam  Nursing note and vitals reviewed. Constitutional: She appears well-developed and well-nourished. No distress.  HENT:  Head: Normocephalic and atraumatic.  Right Ear: External ear normal.  Left Ear: External ear normal.  Mouth/Throat: Oropharynx is clear and moist.  Eyes: Conjunctivae are normal. Pupils are equal, round, and reactive to light. Right eye exhibits no discharge. Left  eye exhibits no discharge. No scleral icterus.  Neck: Normal range of motion. Neck supple.  Cardiovascular: Normal rate, regular rhythm and normal heart sounds.  Exam reveals no gallop and no friction rub.   No murmur heard. Pulmonary/Chest: Effort normal and breath sounds normal. No respiratory distress.  Abdominal: Soft. She exhibits no distension. There is no tenderness.  Musculoskeletal: She exhibits no edema and no tenderness.  Neurological: She is alert.  Skin: Skin is warm and dry. Rash noted.       Generalized nonspecific erythematous rash to trunk and extremities. Blanches. No drainage. No fluctuance. Mucus membranes sparred. nontender.  Psychiatric: She has a normal mood and affect. Her behavior is normal. Thought content normal.    ED Course  Procedures (including critical care time)  Labs Reviewed - No data to display No results found.   1. Rash and nonspecific skin eruption       MDM  25yf with generalized rash. Afebrile and well appearing. No clinical evidence of respiratory distress. Plan symptomatic tx. Return precautions discussed. outpt fu.        Raeford Razor, MD 10/04/11 1422

## 2012-02-23 ENCOUNTER — Emergency Department (HOSPITAL_COMMUNITY)
Admission: EM | Admit: 2012-02-23 | Discharge: 2012-02-23 | Disposition: A | Discharge status: Home / Self Care | Payer: Self-pay | Attending: Emergency Medicine | Admitting: Emergency Medicine

## 2012-02-23 ENCOUNTER — Encounter (HOSPITAL_COMMUNITY): Payer: Self-pay | Admitting: *Deleted

## 2012-02-23 DIAGNOSIS — L309 Dermatitis, unspecified: Secondary | ICD-10-CM

## 2012-02-23 DIAGNOSIS — R21 Rash and other nonspecific skin eruption: Secondary | ICD-10-CM | POA: Insufficient documentation

## 2012-02-23 MED ORDER — HYDROCORTISONE 2.5 % EX LOTN: TOPICAL_LOTION | Freq: Two times a day (BID) | CUTANEOUS | Status: AC

## 2012-02-23 NOTE — ED Notes (Signed)
Pt reports job has had on and off exposure of scabies. Eczema is normally on areas where belt buckles and jewelry are due to allergy, but never broke out in other areas like today.

## 2012-02-23 NOTE — ED Provider Notes (Signed)
History     CSN: [id number]  Arrival date & time 02/23/12  0013   First MD Initiated Contact with Patient 02/23/12 0700      Chief Complaint  Patient presents with  . Rash    (Consider location/radiation/quality/duration/timing/severity/associated sxs/prior treatment) HPI Comments: History of eczema, now with increasing dry skin, itchiness.  Patient is a 26 y.o. female presenting with rash. The history is provided by the patient.  Rash  This is a recurrent problem. Episode onset: several days ago. The problem has been gradually worsening. The problem is associated with nothing. There has been no fever. The rash is present on the face (arms, torso). The patient is experiencing no pain. Associated symptoms include itching.    Past Medical History  Diagnosis Date  . Eczema     History reviewed. No pertinent past surgical history.  History reviewed. No pertinent family history.  History  Substance Use Topics  . Smoking status: Never Smoker   . Smokeless tobacco: Not on file  . Alcohol Use: Yes     OCCASIONAL    OB History    Grav Para Term Preterm Abortions TAB SAB Ect Mult Living                  Review of Systems  Skin: Positive for itching and rash.  All other systems reviewed and are negative.    Allergies  Nickel and Nutritional supplements  Home Medications  No current outpatient prescriptions on file.  BP 113/62  Pulse 68  Temp 98.4 F (36.9 C) (Oral)  Resp 18  SpO2 100%  LMP 02/02/2012  Physical Exam  Nursing note and vitals reviewed. Constitutional: She is oriented to person, place, and time. She appears well-developed and well-nourished.  HENT:  Head: Normocephalic and atraumatic.  Neck: Normal range of motion. Neck supple.  Musculoskeletal: Normal range of motion.  Neurological: She is alert and oriented to person, place, and time.  Skin: Skin is warm.       There is a diffuse, macular, pruritic rash present on arms, face, torso.     ED Course  Procedures (including critical care time)  Labs Reviewed - No data to display No results found.   No diagnosis found.    MDM  This appears to be a flare of eczema.  Will recommend hydrocortisone cream, frequent showers with cetaphil application immediately following.          Geoffery Lyons, MD 02/23/12 [phone]

## 2012-02-23 NOTE — ED Notes (Signed)
Pt c/o rash on bilateral arms, face, ears, back of neck and chest. Pt has hx of eczema. Pt has been using hydrocortisone cream w/o relief.

## 2012-02-23 NOTE — ED Notes (Signed)
Pt reports hx of eczema and stopped taking cream for it a year ago, because she felt like it wasn't working. Pt reports skin is sensitive and allergic to nickel. Pt reports having a rash that start yesterday on forehead. She reports being at house and can't determine what caused it. She started new soap yesterday and thinks it might be that. Pt complains of itching rash on hands, wrists, back of neck, ears, face.

## 2012-08-12 ENCOUNTER — Other Ambulatory Visit (HOSPITAL_COMMUNITY)
Admission: RE | Admit: 2012-08-12 | Discharge: 2012-08-12 | Disposition: A | Discharge status: Home / Self Care | Payer: Self-pay | Source: Ambulatory Visit | Attending: Family Medicine | Admitting: Family Medicine

## 2012-08-12 ENCOUNTER — Other Ambulatory Visit: Payer: Self-pay | Admitting: Family Medicine

## 2012-08-12 DIAGNOSIS — Z0000 Encounter for general adult medical examination without abnormal findings: Secondary | ICD-10-CM | POA: Insufficient documentation

## 2013-11-16 ENCOUNTER — Encounter: Payer: Self-pay | Admitting: Nurse Practitioner

## 2014-11-07 ENCOUNTER — Other Ambulatory Visit: Payer: Self-pay | Admitting: Family Medicine

## 2014-11-07 DIAGNOSIS — E0590 Thyrotoxicosis, unspecified without thyrotoxic crisis or storm: Secondary | ICD-10-CM

## 2014-11-07 DIAGNOSIS — E0789 Other specified disorders of thyroid: Secondary | ICD-10-CM

## 2014-11-15 ENCOUNTER — Other Ambulatory Visit: Payer: Self-pay

## 2014-11-25 ENCOUNTER — Ambulatory Visit
Admission: RE | Admit: 2014-11-25 | Discharge: 2014-11-25 | Disposition: A | Discharge status: Home / Self Care | Payer: Managed Care, Other (non HMO) | Source: Ambulatory Visit | Attending: Family Medicine | Admitting: Family Medicine

## 2014-11-25 DIAGNOSIS — E0789 Other specified disorders of thyroid: Secondary | ICD-10-CM

## 2014-11-25 DIAGNOSIS — E0590 Thyrotoxicosis, unspecified without thyrotoxic crisis or storm: Secondary | ICD-10-CM

## 2016-01-11 DIAGNOSIS — L239 Allergic contact dermatitis, unspecified cause: Secondary | ICD-10-CM | POA: Diagnosis not present

## 2016-02-09 IMAGING — US US SOFT TISSUE HEAD/NECK
1 series · 14 of 25 positions shown · non-contrast
Comparison: 02/02/2008

CLINICAL DATA: Hyperthyroidism, thyroid fullness on exam

EXAM:
THYROID ULTRASOUND
TECHNIQUE: Ultrasound examination of the thyroid gland and adjacent soft
tissues was performed.

[Series 1: us soft tissue head/neck · 0.05mm/px · 14 of 52 slices shown]
[im 1/52]
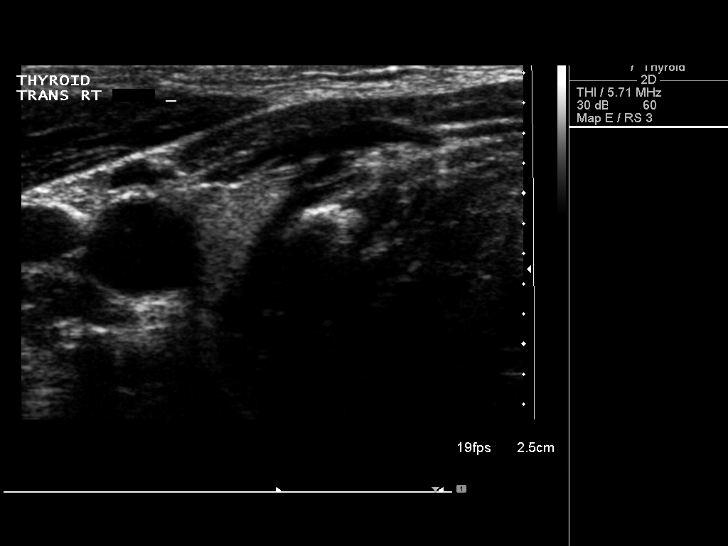
[im 5/52]
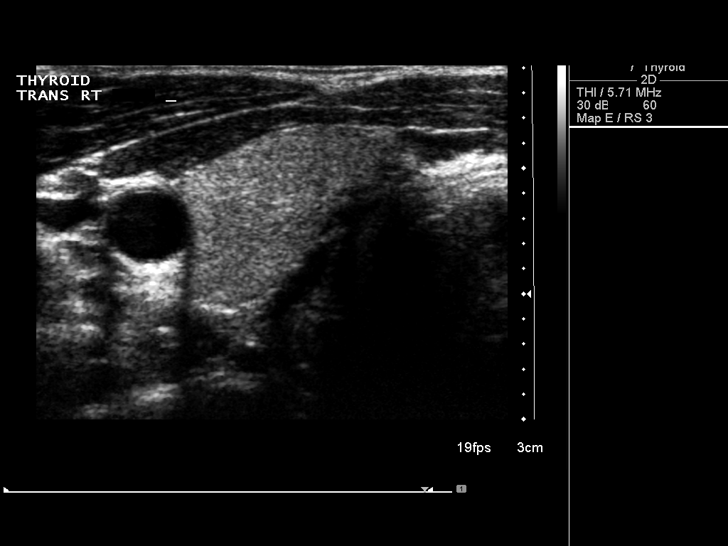
[im 9/52]
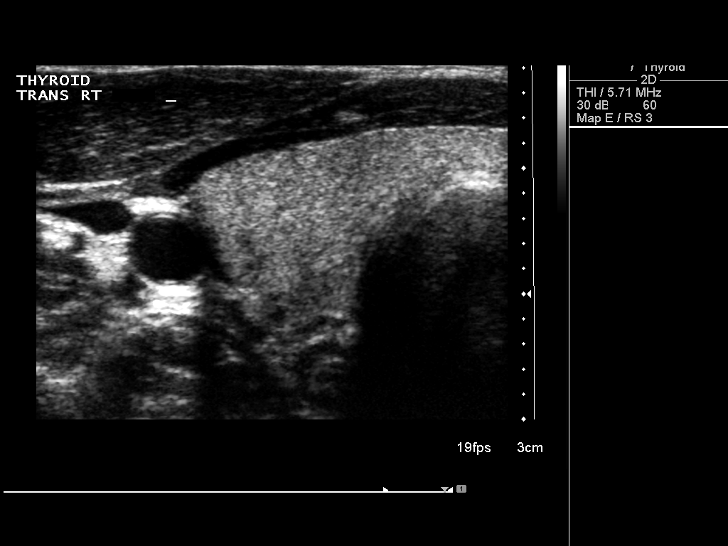
[im 13/52]
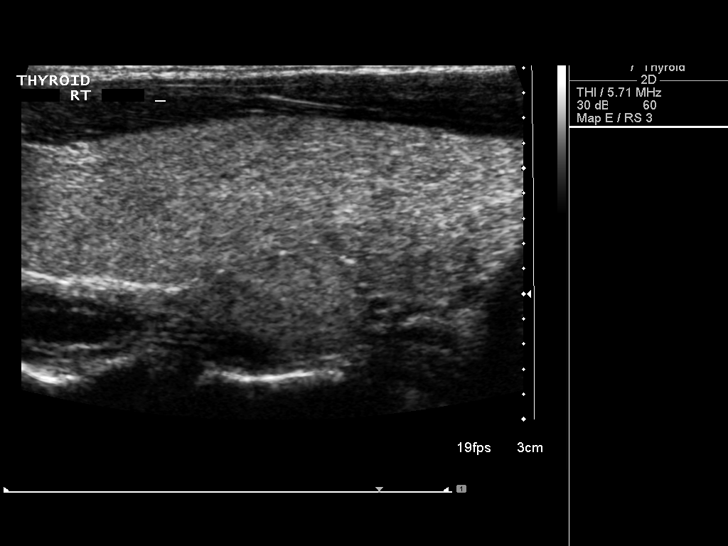
[im 18/52]
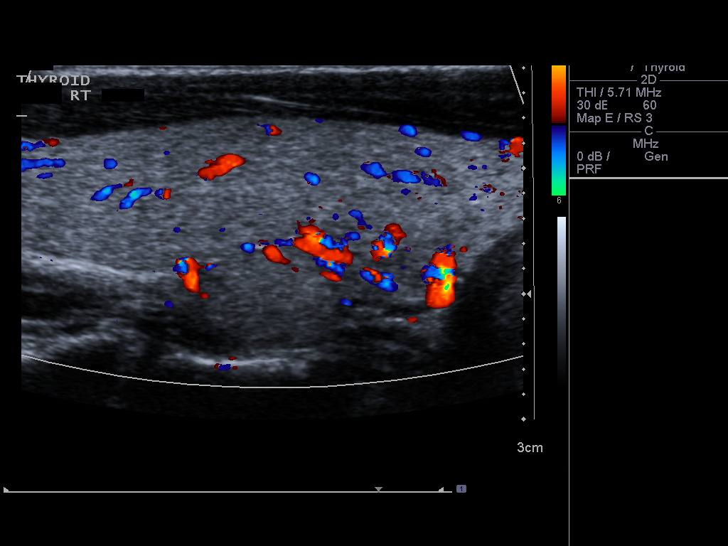
[im 20/52]
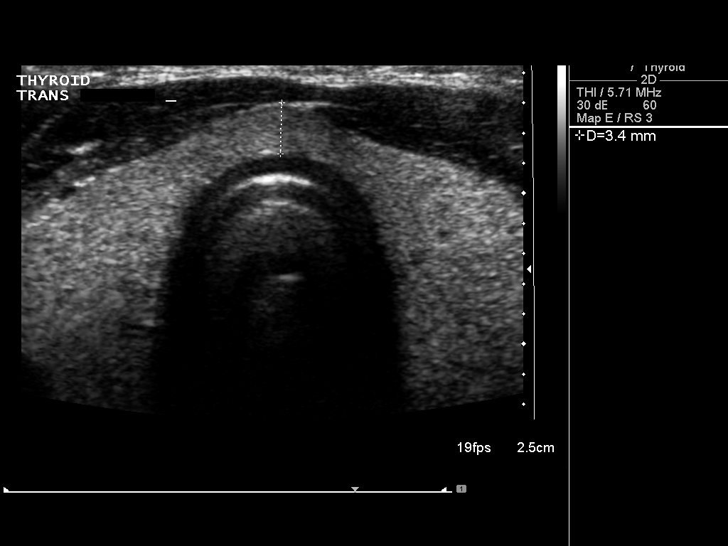
[im 24/52]
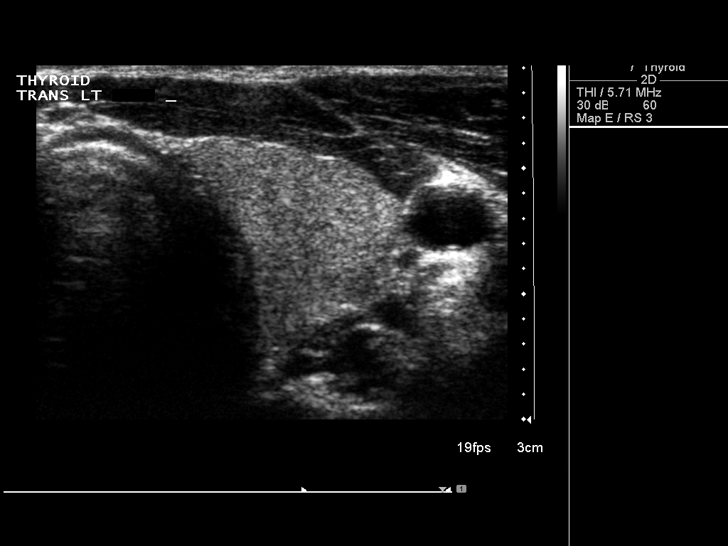
[im 28/52]
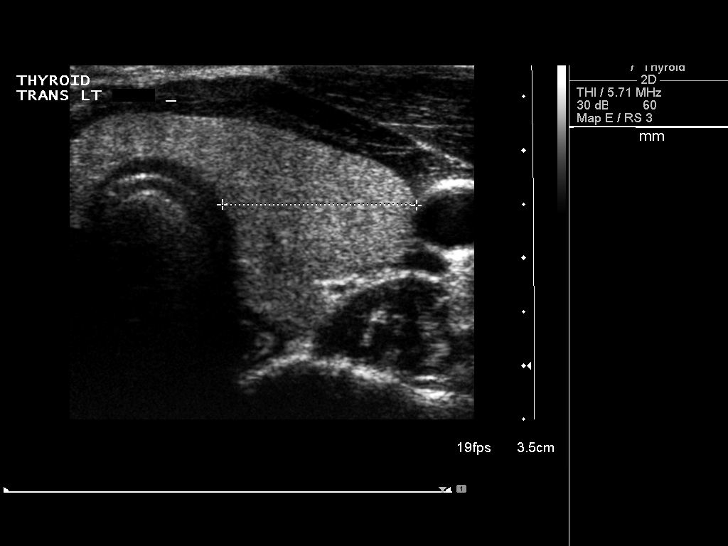
[im 32/52]
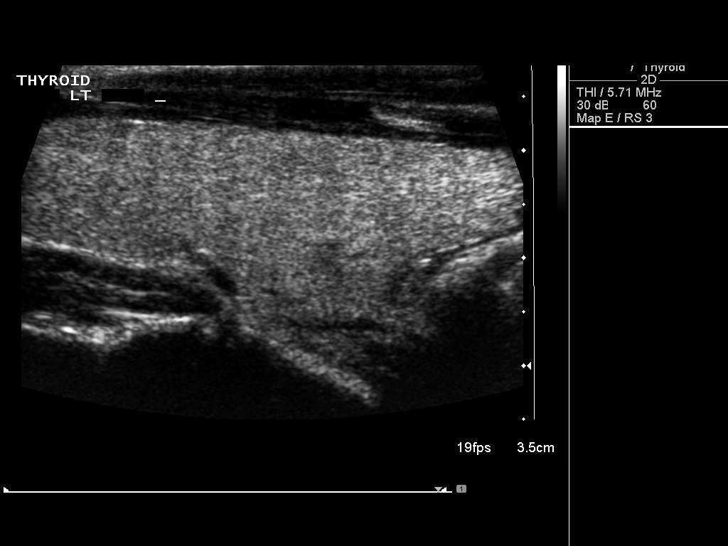
[im 35/52]
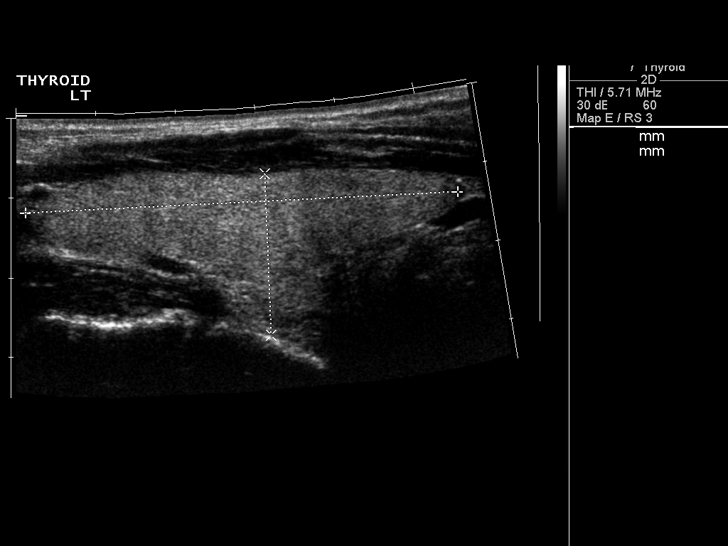
[im 39/52]
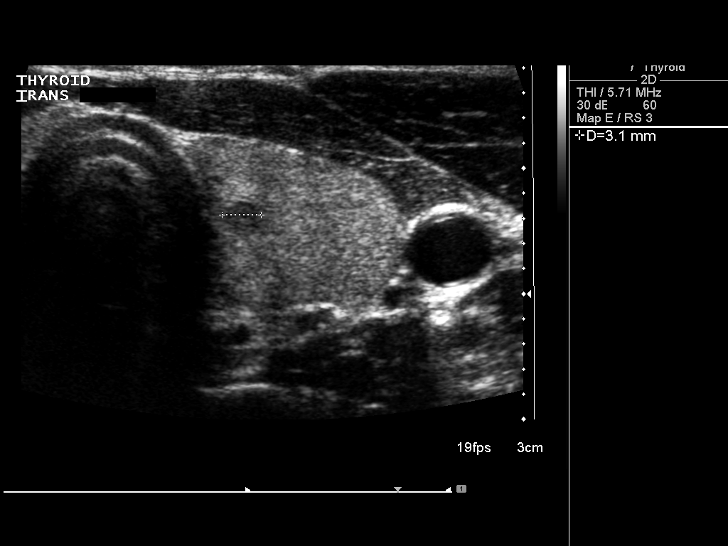
[im 43/52]
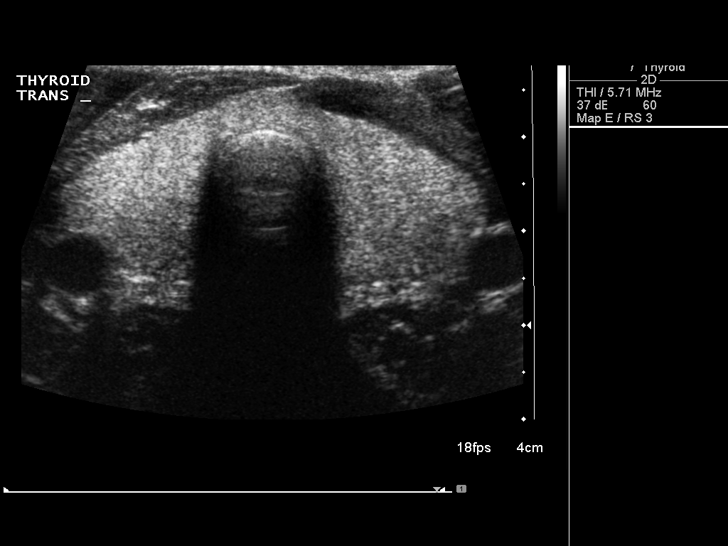
[im 47/52]
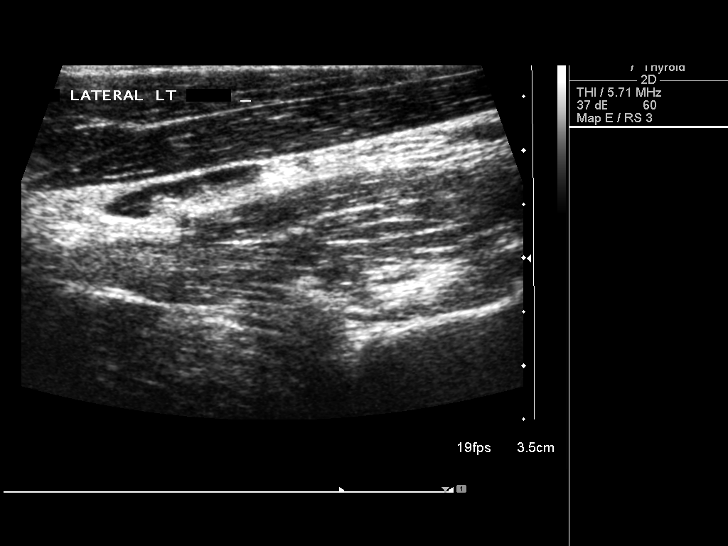
[im 52/52]
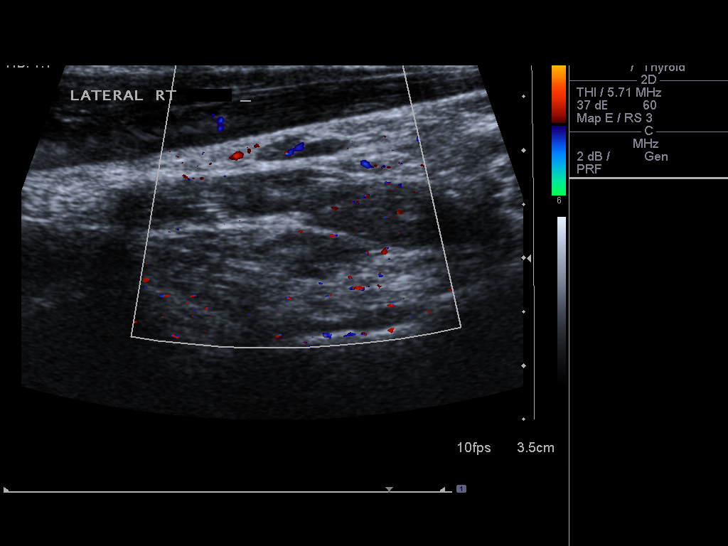

[14 of 25 positions shown; findings below may reference images not displayed]

FINDINGS: Right thyroid lobe

Measurements: 5.4 x 1.7 x 1.8 cm.  No nodules visualized.

Left thyroid lobe

Measurements: 5.4 x 2.0 x 1.8 cm. Small incidental 5 mm hypoechoic
nodule in the left midpole, unchanged. No new finding.

Isthmus

Thickness: 3 mm.  No nodules visualized.

Lymphadenopathy

None visualized.
IMPRESSION: Stable thyroid ultrasound with a small incidental left midpole 5 mm
hypoechoic nodule. Otherwise normal thyroid ultrasound.

## 2016-05-09 ENCOUNTER — Ambulatory Visit (INDEPENDENT_AMBULATORY_CARE_PROVIDER_SITE_OTHER): Payer: 59 | Admitting: Osteopathic Medicine

## 2016-05-09 ENCOUNTER — Encounter: Payer: Self-pay | Admitting: Osteopathic Medicine

## 2016-05-09 VITALS — BP 120/78 | HR 68 | Ht 61.5 in | Wt 128.0 lb

## 2016-05-09 DIAGNOSIS — Z8249 Family history of ischemic heart disease and other diseases of the circulatory system: Secondary | ICD-10-CM

## 2016-05-09 DIAGNOSIS — E041 Nontoxic single thyroid nodule: Secondary | ICD-10-CM

## 2016-05-09 DIAGNOSIS — E0590 Thyrotoxicosis, unspecified without thyrotoxic crisis or storm: Secondary | ICD-10-CM

## 2016-05-09 DIAGNOSIS — Z113 Encounter for screening for infections with a predominantly sexual mode of transmission: Secondary | ICD-10-CM | POA: Diagnosis not present

## 2016-05-09 LAB — CBC WITH DIFFERENTIAL/PLATELET
BASOS PCT: 0 %
Basophils Absolute: 0 cells/uL (ref 0–200)
EOS ABS: 89 {cells}/uL (ref 15–500)
EOS PCT: 1 %
HCT: 44.6 % (ref 35.0–45.0)
Hemoglobin: 14.9 g/dL (ref 11.7–15.5)
LYMPHS PCT: 23 %
Lymphs Abs: 2047 cells/uL (ref 850–3900)
MCH: 29.6 pg (ref 27.0–33.0)
MCHC: 33.4 g/dL (ref 32.0–36.0)
MCV: 88.5 fL (ref 80.0–100.0)
MONOS PCT: 6 %
MPV: 10.8 fL (ref 7.5–12.5)
Monocytes Absolute: 534 cells/uL (ref 200–950)
NEUTROS ABS: 6230 {cells}/uL (ref 1500–7800)
Neutrophils Relative %: 70 %
PLATELETS: 188 10*3/uL (ref 140–400)
RBC: 5.04 MIL/uL (ref 3.80–5.10)
RDW: 12.7 % (ref 11.0–15.0)
WBC: 8.9 10*3/uL (ref 3.8–10.8)

## 2016-05-09 NOTE — Progress Notes (Signed)
HPI: Rachel Benjamin is a 30 y.o. female  who presents to [facility] today, 05/09/16,  for chief complaint of:  Chief Complaint  Patient presents with  . Establish Care   New patient here to establish care - insurance changed and she needs new PCP.   Hx hyperactive thyroid. Uptake scans and US negative for serious pathology, (+)small stable nodule. Previously was on medication, she thinks was methimazole. To her knowledge, no referral to endocrinology. She stopped taking this medication some time ago.    Past medical, surgical, social and family history reviewed: Patient Active Problem List   Diagnosis Date Noted  . 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 DEPRESSION 08/03/2009  . FATIGUE 04/21/2008  . CHLAMYDIAL INFECTION 03/03/2008  . THYROID NODULE 03/02/2008  . ECZEMA 03/02/2008  . ACUTE CYSTITIS 01/28/2008  . Thyrotoxicosis 01/18/2008  . PANIC ATTACK 05/29/2007  . ADHD 05/29/2007  . DYSMENORRHEA 05/29/2007   History reviewed. No pertinent surgical history. Social History  Substance Use Topics  . Smoking status: Never Smoker  . Smokeless tobacco: Never Used  . Alcohol use Yes     Comment: OCCASIONAL   History reviewed. No pertinent family history.   Current medication list and allergy/intolerance information reviewed:   No current outpatient prescriptions on file.   No current facility-administered medications for this visit.    Allergies  Allergen Reactions  . Nickel   . Nutritional Supplements       Review of Systems:  Constitutional:  No  fever, no chills, No recent illness, No unintentional weight changes. +significant fatigue.   HEENT: No  headache, no vision change, no hearing change, No sore throat, No  sinus pressure  Cardiac: No  chest pain, No  pressure, No palpitations, No  Orthopnea  Respiratory:  No  shortness of breath. No  Cough  Gastrointestinal: No  abdominal pain, No  nausea, No  vomiting,  No  blood  in stool, No  diarrhea, No  constipation   Musculoskeletal: No new myalgia/arthralgia  Genitourinary: No  incontinence, No  abnormal genital bleeding, No abnormal genital discharge  Skin: No  Rash, No other wounds/concerning lesions  Hem/Onc: No  easy bruising/bleeding, No  abnormal lymph node  Endocrine: No cold intolerance,  No heat intolerance. No polyuria/polydipsia/polyphagia   Neurologic: No  weakness, No  dizziness, No  slurred speech/focal weakness/facial droop  Psychiatric: No  concerns with depression, No  concerns with anxiety, No sleep problems, No mood problems  Exam:  BP 120/78   Pulse 68   Ht 5' 1.5" (1.562 m)   Wt 128 lb (58.1 kg)   BMI 23.79 kg/m   Constitutional: VS see above. General Appearance: alert, well-developed, well-nourished, NAD  Ears, Nose, Mouth, Throat: MMM, Normal external inspection ears/nares/mouth/lips/gums.  Neck: No masses, trachea midline. No thyroid enlargement. No tenderness/mass appreciated. No lymphadenopathy  Respiratory: Normal respiratory effort. no wheeze, no rhonchi, no rales  Cardiovascular: S1/S2 normal, no murmur, no rub/gallop auscultated. RRR.   Skin: warm, dry, intact.  Psychiatric: Normal judgment/insight. Normal mood and affect. Oriented x3.    Previous records reviewed: TSH low but T3-T4 were fine. Thyroid uptake scans homogenous uptake/negative 2. Recent thyroid ultrasound showed small stable nodule.    ASSESSMENT/PLAN: Labs as below to recheck thyroid levels. Due for Pap. Requests HIV and gonorrhea/chlamydia screening. Patient advised that if thyroid is totally normal, can follow-up for annual physical. If thyroid is abnormal, depending on results she may need follow-up in the office for further  discussion.  Hyperthyroidism - Plan: Thyroid Panel With TSH, CBC with Differential/Platelet, COMPLETE METABOLIC PANEL WITH GFR, Lipid panel  THYROID NODULE  Family history of cardiac disorder - Plan: Lipid  panel  Routine screening for STI (sexually transmitted infection) - Plan: HIV antibody, GC/chlamydia probe amp, urine     Visit summary with medication list and pertinent instructions was printed for patient to review. All questions at time of visit were answered - patient instructed to contact office with any additional concerns. ER/RTC precautions were reviewed with the patient. Follow-up plan: Return in about 8 weeks (around 07/04/2016) for PAP/ANNUAL PHYSICAL, sooner if needed and based on labs.

## 2016-05-09 NOTE — Addendum Note (Signed)
Addended by: Pixie CasinoUNNINGHAM, Hibo Blasdell C on: 05/09/2016 10:13 AM   Modules accepted: Orders

## 2016-05-09 NOTE — Patient Instructions (Signed)
Hyperthyroidism Hyperthyroidism is when the thyroid is too active (overactive). Your thyroid is a large gland that is located in your neck. The thyroid helps to control how your body uses food (metabolism). When your thyroid is overactive, it produces too much of a hormone called thyroxine. What are the causes? Causes of hyperthyroidism may include:  Graves disease. This is when your immune system attacks the thyroid gland. This is the most common cause.  Inflammation of the thyroid gland.  Tumor in the thyroid gland or somewhere else.  Excessive use of thyroid medicines, including:  Prescription thyroid supplement.  Herbal supplements that mimic thyroid hormones.  Solid or fluid-filled lumps within your thyroid gland (thyroid nodules).  Excessive ingestion of iodine. What increases the risk?  Being female.  Having a family history of thyroid conditions. What are the signs or symptoms? Signs and symptoms of hyperthyroidism may include:  Nervousness.  Inability to tolerate heat.  Unexplained weight loss.  Diarrhea.  Change in the texture of hair or skin.  Heart skipping beats or making extra beats.  Rapid heart rate.  Loss of menstruation.  Shaky hands.  Fatigue.  Restlessness.  Increased appetite.  Sleep problems.  Enlarged thyroid gland or nodules. How is this diagnosed? Diagnosis of hyperthyroidism may include:  Medical history and physical exam.  Blood tests.  Ultrasound tests. How is this treated? Treatment may include:  Medicines to control your thyroid.  Surgery to remove your thyroid.  Radiation therapy. Follow these instructions at home:  Take medicines only as directed by your health care provider.  Do not use any tobacco products, including cigarettes, chewing tobacco, or electronic cigarettes. If you need help quitting, ask your health care provider.  Keep all follow-up appointments as directed by your health care provider. This  is important. Contact a health care provider if:  Your symptoms do not get better with treatment.  You have fever.  You are taking thyroid replacement medicine and you:  Have depression.  Feel mentally and physically slow.  Have weight gain. Get help right away if:  You have decreased alertness or a change in your awareness.  You have abdominal pain.  You feel dizzy.  You have a rapid heartbeat.  You have an irregular heartbeat. This information is not intended to replace advice given to you by your health care provider. Make sure you discuss any questions you have with your health care provider. Document Released: 06/10/2005 Document Revised: 11/09/2015 Document Reviewed: 10/26/2013 Elsevier Interactive Patient Education  2017 [employer]

## 2016-05-09 NOTE — Addendum Note (Signed)
Addended by: Willey BladeUNNINGHAM, RHONDA C on: 05/09/2016 11:40 AM   Modules accepted: Orders

## 2016-05-10 LAB — THYROID PANEL WITH TSH
FREE THYROXINE INDEX: 1.9 (ref 1.4–3.8)
T3 UPTAKE: 29 % (ref 22–35)
T4, Total: 6.7 ug/dL (ref 4.5–12.0)
TSH: 0.69 m[IU]/L

## 2016-05-10 LAB — COMPLETE METABOLIC PANEL WITH GFR
ALT: 8 U/L (ref 6–29)
AST: 16 U/L (ref 10–30)
Albumin: 4.5 g/dL (ref 3.6–5.1)
Alkaline Phosphatase: 39 U/L (ref 33–115)
BUN: 10 mg/dL (ref 7–25)
CHLORIDE: 104 mmol/L (ref 98–110)
CO2: 21 mmol/L (ref 20–31)
Calcium: 9.7 mg/dL (ref 8.6–10.2)
Creat: 0.9 mg/dL (ref 0.50–1.10)
GFR, EST NON AFRICAN AMERICAN: 87 mL/min (ref >=60)
GLUCOSE: 89 mg/dL (ref 65–99)
Potassium: 3.9 mmol/L (ref 3.5–5.3)
SODIUM: 138 mmol/L (ref 135–146)
Total Bilirubin: 0.7 mg/dL (ref 0.2–1.2)
Total Protein: 7.4 g/dL (ref 6.1–8.1)

## 2016-05-10 LAB — LIPID PANEL
Cholesterol: 158 mg/dL (ref <200)
HDL: 51 mg/dL (ref >50)
LDL CALC: 92 mg/dL (ref <100)
TRIGLYCERIDES: 75 mg/dL (ref <150)
Total CHOL/HDL Ratio: 3.1 Ratio (ref <5.0)
VLDL: 15 mg/dL (ref <30)

## 2016-05-10 LAB — HIV ANTIBODY (ROUTINE TESTING W REFLEX): HIV 1&2 Ab, 4th Generation: NONREACTIVE

## 2016-05-10 LAB — GC/CHLAMYDIA PROBE AMP
CT Probe RNA: NOT DETECTED
GC PROBE AMP APTIMA: NOT DETECTED

## 2017-03-14 ENCOUNTER — Inpatient Hospital Stay (HOSPITAL_COMMUNITY)
Admission: AD | Admit: 2017-03-14 | Discharge: 2017-03-14 | Discharge status: Absent without leave | Payer: 59 | Attending: Obstetrics and Gynecology | Admitting: Obstetrics and Gynecology

## 2017-10-08 ENCOUNTER — Encounter (HOSPITAL_COMMUNITY)
Admission: AD | Disposition: A | Discharge status: Home / Self Care | Payer: Self-pay | Source: Ambulatory Visit | Attending: Obstetrics and Gynecology

## 2017-10-08 ENCOUNTER — Encounter (HOSPITAL_COMMUNITY): Payer: Self-pay

## 2017-10-08 ENCOUNTER — Ambulatory Visit (HOSPITAL_COMMUNITY)
Admission: AD | Admit: 2017-10-08 | Discharge: 2017-10-09 | Disposition: A | Discharge status: Home / Self Care | Payer: Self-pay | Source: Ambulatory Visit | Attending: Obstetrics and Gynecology | Admitting: Obstetrics and Gynecology

## 2017-10-08 ENCOUNTER — Inpatient Hospital Stay (HOSPITAL_COMMUNITY): Payer: Self-pay

## 2017-10-08 DIAGNOSIS — O209 Hemorrhage in early pregnancy, unspecified: Secondary | ICD-10-CM

## 2017-10-08 DIAGNOSIS — O3680X0 Pregnancy with inconclusive fetal viability, not applicable or unspecified: Secondary | ICD-10-CM

## 2017-10-08 DIAGNOSIS — O00101 Right tubal pregnancy without intrauterine pregnancy: Secondary | ICD-10-CM

## 2017-10-08 DIAGNOSIS — Z8249 Family history of ischemic heart disease and other diseases of the circulatory system: Secondary | ICD-10-CM | POA: Insufficient documentation

## 2017-10-08 DIAGNOSIS — Z809 Family history of malignant neoplasm, unspecified: Secondary | ICD-10-CM | POA: Insufficient documentation

## 2017-10-08 DIAGNOSIS — B9689 Other specified bacterial agents as the cause of diseases classified elsewhere: Secondary | ICD-10-CM

## 2017-10-08 DIAGNOSIS — N760 Acute vaginitis: Secondary | ICD-10-CM

## 2017-10-08 HISTORY — PX: DIAGNOSTIC LAPAROSCOPY WITH REMOVAL OF ECTOPIC PREGNANCY: SHX6449

## 2017-10-08 LAB — WET PREP, GENITAL
SPERM: NONE SEEN
Trich, Wet Prep: NONE SEEN
WBC WET PREP: NONE SEEN
Yeast Wet Prep HPF POC: NONE SEEN

## 2017-10-08 LAB — URINALYSIS, ROUTINE W REFLEX MICROSCOPIC
Bilirubin Urine: NEGATIVE
GLUCOSE, UA: NEGATIVE mg/dL
Ketones, ur: 20 mg/dL — AB
Leukocytes, UA: NEGATIVE
NITRITE: POSITIVE — AB
Protein, ur: 30 mg/dL — AB
SPECIFIC GRAVITY, URINE: 1.034 — AB (ref 1.005–1.030)
pH: 5 (ref 5.0–8.0)

## 2017-10-08 LAB — HCG, QUANTITATIVE, PREGNANCY: HCG, BETA CHAIN, QUANT, S: 1958 m[IU]/mL — AB (ref <5)

## 2017-10-08 LAB — CBC
HEMATOCRIT: 39.7 % (ref 36.0–46.0)
HEMOGLOBIN: 14.1 g/dL (ref 12.0–15.0)
MCH: 30.1 pg (ref 26.0–34.0)
MCHC: 35.5 g/dL (ref 30.0–36.0)
MCV: 84.6 fL (ref 78.0–100.0)
Platelets: 211 10*3/uL (ref 150–400)
RBC: 4.69 MIL/uL (ref 3.87–5.11)
RDW: 12.3 % (ref 11.5–15.5)
WBC: 10.7 10*3/uL — ABNORMAL HIGH (ref 4.0–10.5)

## 2017-10-08 LAB — ABO/RH: ABO/RH(D): A POS

## 2017-10-08 LAB — POCT PREGNANCY, URINE: PREG TEST UR: POSITIVE — AB

## 2017-10-08 SURGERY — LAPAROSCOPY, WITH ECTOPIC PREGNANCY SURGICAL TREATMENT
Anesthesia: General | Site: Abdomen | Laterality: Right

## 2017-10-08 MED ORDER — SUCCINYLCHOLINE CHLORIDE 200 MG/10ML IV SOSY
PREFILLED_SYRINGE | INTRAVENOUS | Status: AC
  Filled 2017-10-08: qty 10

## 2017-10-08 MED ORDER — FENTANYL CITRATE (PF) 250 MCG/5ML IJ SOLN
INTRAMUSCULAR | Status: AC
Start: 2017-10-08 — End: 2017-10-08
  Filled 2017-10-08: qty 5

## 2017-10-08 MED ORDER — PROPOFOL 10 MG/ML IV BOLUS
INTRAVENOUS | Status: AC
  Filled 2017-10-08: qty 20

## 2017-10-08 MED ORDER — FAMOTIDINE IN NACL 20-0.9 MG/50ML-% IV SOLN
20.0000 mg | Freq: Once | INTRAVENOUS | Status: AC
  Administered 2017-10-08: 20 mg via INTRAVENOUS
  Filled 2017-10-08: qty 50

## 2017-10-08 MED ORDER — LIDOCAINE HCL (CARDIAC) PF 100 MG/5ML IV SOSY
PREFILLED_SYRINGE | INTRAVENOUS | Status: AC
  Filled 2017-10-08: qty 5

## 2017-10-08 MED ORDER — DEXAMETHASONE SODIUM PHOSPHATE 4 MG/ML IJ SOLN
INTRAMUSCULAR | Status: AC
  Filled 2017-10-08: qty 1

## 2017-10-08 MED ORDER — ONDANSETRON HCL 4 MG/2ML IJ SOLN
INTRAMUSCULAR | Status: AC
  Filled 2017-10-08: qty 2

## 2017-10-08 MED ORDER — SOD CITRATE-CITRIC ACID 500-334 MG/5ML PO SOLN
30.0000 mL | Freq: Once | ORAL | Status: AC
  Administered 2017-10-09: 30 mL via ORAL
  Filled 2017-10-08: qty 15

## 2017-10-08 MED ORDER — ROCURONIUM BROMIDE 100 MG/10ML IV SOLN
INTRAVENOUS | Status: AC
  Filled 2017-10-08: qty 1

## 2017-10-08 MED ORDER — MIDAZOLAM HCL 2 MG/2ML IJ SOLN
INTRAMUSCULAR | Status: AC
  Filled 2017-10-08: qty 2

## 2017-10-08 MED ORDER — SODIUM CHLORIDE 0.9 % IV SOLN
INTRAVENOUS | Status: DC
Start: 2017-10-08 — End: 2017-10-09
  Administered 2017-10-08: 23:00:00 via INTRAVENOUS

## 2017-10-08 SURGICAL SUPPLY — 30 items
BENZOIN TINCTURE PRP APPL 2/3 (GAUZE/BANDAGES/DRESSINGS) ×2 IMPLANT
BLADE 11 SAFETY STRL DISP (BLADE) ×2 IMPLANT
DECANTER SPIKE VIAL GLASS SM (MISCELLANEOUS) ×6 IMPLANT
DRSG OPSITE POSTOP 3X4 (GAUZE/BANDAGES/DRESSINGS) ×2 IMPLANT
GLOVE BIO SURGEON ST LM GN SZ9 (GLOVE) ×2 IMPLANT
GLOVE BIOGEL PI IND STRL 7.0 (GLOVE) ×2 IMPLANT
GLOVE BIOGEL PI IND STRL 9 (GLOVE) ×2 IMPLANT
GLOVE BIOGEL PI INDICATOR 7.0 (GLOVE) ×2
GLOVE BIOGEL PI INDICATOR 9 (GLOVE) ×2
GLOVE SURG SS PI 7.0 STRL IVOR (GLOVE) ×8 IMPLANT
GOWN STRL REUS W/TWL 2XL LVL3 (GOWN DISPOSABLE) ×2 IMPLANT
GOWN STRL REUS W/TWL LRG LVL3 (GOWN DISPOSABLE) ×4 IMPLANT
NEEDLE HYPO 22GX1.5 SAFETY (NEEDLE) ×2 IMPLANT
NEEDLE INSUFFLATION 120MM (ENDOMECHANICALS) ×2 IMPLANT
NEEDLE SPNL 22GX1.5 QUINCKE BK (NEEDLE) ×2 IMPLANT
NS IRRIG 1000ML POUR BTL (IV SOLUTION) ×2 IMPLANT
PACK LAPAROSCOPY BASIN (CUSTOM PROCEDURE TRAY) ×2 IMPLANT
PACK TRENDGUARD 450 HYBRID PRO (MISCELLANEOUS) ×1 IMPLANT
POUCH SPECIMEN RETRIEVAL 10MM (ENDOMECHANICALS) ×2 IMPLANT
PROTECTOR NERVE ULNAR (MISCELLANEOUS) ×4 IMPLANT
SET IRRIG TUBING LAPAROSCOPIC (IRRIGATION / IRRIGATOR) ×2 IMPLANT
STRIP CLOSURE SKIN 1/2X4 (GAUZE/BANDAGES/DRESSINGS) ×2 IMPLANT
SUT VIC AB 4-0 PS2 27 (SUTURE) ×2 IMPLANT
SUT VICRYL 0 UR6 27IN ABS (SUTURE) ×4 IMPLANT
SYR CONTROL 10ML LL (SYRINGE) ×2 IMPLANT
TOWEL OR 17X24 6PK STRL BLUE (TOWEL DISPOSABLE) ×4 IMPLANT
TRAY FOLEY W/BAG SLVR 14FR (SET/KITS/TRAYS/PACK) ×2 IMPLANT
TRENDGUARD 450 HYBRID PRO PACK (MISCELLANEOUS) ×2
TROCAR XCEL NON-BLD 11X100MML (ENDOMECHANICALS) ×2 IMPLANT
WARMER LAPAROSCOPE (MISCELLANEOUS) ×2 IMPLANT

## 2017-10-08 NOTE — MAU Note (Addendum)
Pt states she started period on 4/6. States she had intercourse 4/5 with boyfriend and took plan b several days later. Pt states she is still having vaginal bleeding. States on Monday she had a +upt at home, as well as today. Pt reports right sided hip/groin pain and pain on upper left side- none now. Pt not on birth control

## 2017-10-08 NOTE — Progress Notes (Addendum)
G2P0. LMP April 6. Bleeding for past 12 days. Urine obtained. Planned B taken April 6. Period started on April 6.   1757: Provider at bs assessing pt.   Monday home UPT+   2030: pelvic exam with wetprep and GC done.

## 2017-10-08 NOTE — MAU Provider Note (Addendum)
History     CSN: [id number]  Arrival date and time: 10/08/17 5621   First Provider Initiated Contact with Patient 10/08/17 1955      Chief Complaint  Patient presents with  . Possible Pregnancy  . Vaginal Bleeding   Rachel Benjamin is a 32 y.o. G2P0010 approximately [redacted] wks EGA with LNMP 08/27/2017, then began menstrual-like bleeding 09/27/2017 and has continued bleeding daily since then. She had unprotected intercourse 09/26/2017 and took Plan B on 09/27/2017 after she began bleeding. Had first +HPT 3 days ago. Bleeding is associated with intermittent cramping and left mid abdominal pain.     OB History  Gravida Para Term Preterm AB Living  2 0     1 0  SAB TAB Ectopic Multiple Live Births  1            # Outcome Date GA Lbr Len/2nd Weight Sex Delivery Anes PTL Lv  2 Gravida           1 SAB              Past Medical History:  Diagnosis Date  . Eczema     No past surgical history on file.  Family History  Problem Relation Age of Onset  . Cancer Mother   . Heart disease Father   . Pulmonary embolism Father     Social History   Tobacco Use  . Smoking status: Never Smoker  . Smokeless tobacco: Never Used  Substance Use Topics  . Alcohol use: Yes    Comment: OCCASIONAL  . Drug use: No    Allergies:  Allergies  Allergen Reactions  . Nickel   . Nutritional Supplements     No medications prior to admission.    Review of Systems  Constitutional: Positive for appetite change. Negative for chills and fever.  Cardiovascular: Negative for chest pain.  Gastrointestinal: Positive for abdominal pain. Negative for anal bleeding, constipation, diarrhea, nausea and vomiting.  Genitourinary: Positive for frequency, pelvic pain and vaginal bleeding. Negative for dysuria, flank pain, hematuria, urgency and vaginal discharge.  Neurological: Negative for dizziness.   Physical Exam   Blood pressure 122/79, pulse 70, temperature 98.2 F (36.8 C), temperature source Oral,  resp. rate 18, height 5' 1.5" (1.562 m), weight 126 lb (57.2 kg), last menstrual period 09/27/2017, SpO2 99 %.  Physical Exam  Nursing note and vitals reviewed. Constitutional: She is oriented to person, place, and time. She appears well-developed and well-nourished. No distress.  HENT:  Head: Normocephalic.  Eyes: No scleral icterus.  Neck: Neck supple. No thyromegaly present.  Cardiovascular: Normal rate.  Respiratory: Effort normal.  GI: Soft. There is tenderness. There is no rebound and no guarding.  Minimal suprapubic tenderness  Musculoskeletal: Normal range of motion.  Neurological: She is alert and oriented to person, place, and time.  Skin: Skin is warm and dry.   CLINICAL DATA:  Vaginal bleeding. Gestational age by last menstrual period 1 week and 4 days. Beta HCG 1,958.  EXAM: OBSTETRIC <14 WK [place] AND TRANSVAGINAL OB US  TECHNIQUE: Both transabdominal and transvaginal ultrasound examinations were performed for complete evaluation of the gestation as well as the maternal uterus, adnexal regions, and pelvic cul-de-sac. Transvaginal technique was performed to assess early pregnancy.  COMPARISON:  None.  FINDINGS: Intrauterine gestational sac: Not present  Yolk sac:  Not present  Embryo:  Not present  Subchorionic hemorrhage:  None visualized.  Maternal uterus/adnexae: Multiple intramural measuring to 19 mm. Moderate amount of complex free  fluid in the pelvis. RIGHT para ovarian tubular thick-walled structure measuring to 14 mm with central anechoic component, not particularly vascular. Within or adjacent is a 1.3 cm thick-walled cystic structure with anechoic central component.  IMPRESSION: 1. No sonographically identified IUP. Please note, with beta HCG of less than 3,000, IUP may not be sonographically evident. 2. RIGHT lower quadrant suspected ectopic pregnancy. Adjacent irregular tubal structure consistent with complex fluid, less  likely appendicitis or pyosalpinx. 3. Moderate free fluid in the pelvis. 4. Acute findings discussed with and reconfirmed by Wynelle BourgeoisMarie Williams, NP on 10/08/2017 at 9:52 pm.   Electronically Signed   By: Awilda Metroourtnay  Bloomer M.D.   On: 10/08/2017 21:57 MAU Course  Procedures Results for orders placed or performed during the hospital encounter of 10/08/17 (from the past 24 hour(s))  Urinalysis, Routine w reflex microscopic     Status: Abnormal   Collection Time: 10/08/17  7:35 PM  Result Value Ref Range   Color, Urine AMBER (A) YELLOW   APPearance HAZY (A) CLEAR   Specific Gravity, Urine 1.034 (H) 1.005 - 1.030   pH 5.0 5.0 - 8.0   Glucose, UA NEGATIVE NEGATIVE mg/dL   Hgb urine dipstick LARGE (A) NEGATIVE   Bilirubin Urine NEGATIVE NEGATIVE   Ketones, ur 20 (A) NEGATIVE mg/dL   Protein, ur 30 (A) NEGATIVE mg/dL   Nitrite POSITIVE (A) NEGATIVE   Leukocytes, UA NEGATIVE NEGATIVE   RBC / HPF 0-5 0 - 5 RBC/hpf   WBC, UA 0-5 0 - 5 WBC/hpf   Bacteria, UA MANY (A) NONE SEEN   Squamous Epithelial / LPF 0-5 (A) NONE SEEN   Mucus PRESENT   Pregnancy, urine POC     Status: Abnormal   Collection Time: 10/08/17  7:50 PM  Result Value Ref Range   Preg Test, Ur POSITIVE (A) NEGATIVE  CBC     Status: Abnormal   Collection Time: 10/08/17  8:20 PM  Result Value Ref Range   WBC 10.7 (H) 4.0 - 10.5 K/uL   RBC 4.69 3.87 - 5.11 MIL/uL   Hemoglobin 14.1 12.0 - 15.0 g/dL   HCT 40.939.7 81.136.0 - 91.446.0 %   MCV 84.6 78.0 - 100.0 fL   MCH 30.1 26.0 - 34.0 pg   MCHC 35.5 30.0 - 36.0 g/dL   RDW 78.212.3 95.611.5 - 21.315.5 %   Platelets 211 150 - 400 K/uL  ABO/Rh     Status: None (Preliminary result)   Collection Time: 10/08/17  8:20 PM  Result Value Ref Range   ABO/RH(D)      A POS Performed at [facility], [address] [place], [place] [id number]   Wet prep, genital     Status: Abnormal   Collection Time: 10/08/17  8:35 PM  Result Value Ref Range   Yeast Wet Prep HPF POC NONE SEEN NONE SEEN   Trich, Wet  Prep NONE SEEN NONE SEEN   Clue Cells Wet Prep HPF POC PRESENT (A) NONE SEEN   WBC, Wet Prep HPF POC NONE SEEN NONE SEEN   Sperm NONE SEEN     GC/CT, HIV, urine culture sent  MDM Care assumed by Wynelle BourgeoisMarie Williams, CNM at 2120, labs and US pending Danae Orleansoe, Deirdre C, CNM 10/08/2017 9:19 PM Care assumed by JVferugson AT 10:45pm ([id number]) U/s consistent with right ectopic, leaking/or ruptured.  I do not feel waiting 48 hours to confirm by u/s and Qhcg as safe as laparoscopy.    Assessment and Plan   1. Bleeding in early  pregnancy   2. BV (bacterial vaginosis)   3 right ECTOPIC PREGNANCY. PLAN: OPTIONS DISCUSSED , PT MORE COMFORTABLE WITH THE POSSIBILITY OF LAPAROSCOPY VOL PLAN : npo             TO OR FOR LAPAROSCOPY FOR RIGHT ECTOPIC PREGNANCY.

## 2017-10-09 ENCOUNTER — Inpatient Hospital Stay (HOSPITAL_COMMUNITY): Payer: Self-pay | Admitting: Certified Registered Nurse Anesthetist

## 2017-10-09 ENCOUNTER — Encounter (HOSPITAL_COMMUNITY): Payer: Self-pay | Admitting: Obstetrics and Gynecology

## 2017-10-09 DIAGNOSIS — O00101 Right tubal pregnancy without intrauterine pregnancy: Secondary | ICD-10-CM

## 2017-10-09 LAB — COMPREHENSIVE METABOLIC PANEL
ALBUMIN: 4.7 g/dL (ref 3.5–5.0)
ALT: 10 U/L — ABNORMAL LOW (ref 14–54)
AST: 15 U/L (ref 15–41)
Alkaline Phosphatase: 38 U/L (ref 38–126)
Anion gap: 11 (ref 5–15)
BUN: 8 mg/dL (ref 6–20)
CHLORIDE: 105 mmol/L (ref 101–111)
CO2: 22 mmol/L (ref 22–32)
CREATININE: 0.82 mg/dL (ref 0.44–1.00)
Calcium: 9.3 mg/dL (ref 8.9–10.3)
GFR calc Af Amer: >60 mL/min (ref >60)
GLUCOSE: 99 mg/dL (ref 65–99)
POTASSIUM: 3.3 mmol/L — AB (ref 3.5–5.1)
SODIUM: 138 mmol/L (ref 135–145)
Total Bilirubin: 0.7 mg/dL (ref 0.3–1.2)
Total Protein: 7.9 g/dL (ref 6.5–8.1)

## 2017-10-09 LAB — HIV ANTIBODY (ROUTINE TESTING W REFLEX): HIV SCREEN 4TH GENERATION: NONREACTIVE

## 2017-10-09 LAB — GC/CHLAMYDIA PROBE AMP (~~LOC~~) NOT AT ARMC
Chlamydia: NEGATIVE
Neisseria Gonorrhea: NEGATIVE

## 2017-10-09 MED ORDER — VASOPRESSIN 20 UNIT/ML IV SOLN
INTRAVENOUS | Status: DC | PRN
  Administered 2017-10-09: 6 mL via INTRAMUSCULAR

## 2017-10-09 MED ORDER — LIDOCAINE HCL (CARDIAC) PF 100 MG/5ML IV SOSY
PREFILLED_SYRINGE | INTRAVENOUS | Status: DC | PRN
  Administered 2017-10-09: 100 mg via INTRAVENOUS

## 2017-10-09 MED ORDER — SUGAMMADEX SODIUM 500 MG/5ML IV SOLN
INTRAVENOUS | Status: DC | PRN
  Administered 2017-10-09: 125 mg via INTRAVENOUS

## 2017-10-09 MED ORDER — ROCURONIUM BROMIDE 100 MG/10ML IV SOLN
INTRAVENOUS | Status: DC | PRN
  Administered 2017-10-09: 20 mg via INTRAVENOUS
  Administered 2017-10-09: 5 mg via INTRAVENOUS

## 2017-10-09 MED ORDER — DEXAMETHASONE SODIUM PHOSPHATE 4 MG/ML IJ SOLN
INTRAMUSCULAR | Status: DC | PRN
  Administered 2017-10-09: 4 mg via INTRAVENOUS

## 2017-10-09 MED ORDER — HYDROMORPHONE HCL 1 MG/ML IJ SOLN
INTRAMUSCULAR | Status: AC
  Filled 2017-10-09: qty 1

## 2017-10-09 MED ORDER — PROPOFOL 10 MG/ML IV BOLUS
INTRAVENOUS | Status: DC | PRN
  Administered 2017-10-09: 200 mg via INTRAVENOUS

## 2017-10-09 MED ORDER — PROMETHAZINE HCL 25 MG/ML IJ SOLN
INTRAMUSCULAR | Status: AC
  Filled 2017-10-09: qty 1

## 2017-10-09 MED ORDER — PROMETHAZINE HCL 25 MG/ML IJ SOLN
6.2500 mg | INTRAMUSCULAR | Status: DC | PRN
  Administered 2017-10-09: 6.25 mg via INTRAVENOUS

## 2017-10-09 MED ORDER — KETOROLAC TROMETHAMINE 30 MG/ML IJ SOLN
INTRAMUSCULAR | Status: DC | PRN
  Administered 2017-10-09: 30 mg via INTRAVENOUS

## 2017-10-09 MED ORDER — FENTANYL CITRATE (PF) 100 MCG/2ML IJ SOLN
INTRAMUSCULAR | Status: DC | PRN
  Administered 2017-10-09 (×3): 50 ug via INTRAVENOUS

## 2017-10-09 MED ORDER — KETOROLAC TROMETHAMINE 30 MG/ML IJ SOLN
INTRAMUSCULAR | Status: AC
  Filled 2017-10-09: qty 1

## 2017-10-09 MED ORDER — LACTATED RINGERS IV SOLN
INTRAVENOUS | Status: DC | PRN
  Administered 2017-10-09: 01:00:00 via INTRAVENOUS

## 2017-10-09 MED ORDER — ONDANSETRON HCL 4 MG/2ML IJ SOLN
INTRAMUSCULAR | Status: DC | PRN
  Administered 2017-10-09: 4 mg via INTRAVENOUS

## 2017-10-09 MED ORDER — SUCCINYLCHOLINE CHLORIDE 20 MG/ML IJ SOLN
INTRAMUSCULAR | Status: DC | PRN
  Administered 2017-10-09: 120 mg via INTRAVENOUS

## 2017-10-09 MED ORDER — MEPERIDINE HCL 25 MG/ML IJ SOLN
INTRAMUSCULAR | Status: AC
  Filled 2017-10-09: qty 1

## 2017-10-09 MED ORDER — SODIUM CHLORIDE 0.9 % IJ SOLN
INTRAMUSCULAR | Status: DC | PRN
  Administered 2017-10-09: 6 mL

## 2017-10-09 MED ORDER — HYDROMORPHONE HCL 1 MG/ML IJ SOLN
0.2500 mg | INTRAMUSCULAR | Status: DC | PRN
  Administered 2017-10-09: 0.5 mg via INTRAVENOUS

## 2017-10-09 MED ORDER — METHOTREXATE INJECTION FOR WOMEN'S HOSPITAL
50.0000 mg/m2 | Freq: Once | INTRAMUSCULAR | Status: AC
  Administered 2017-10-09: 80 mg via INTRAMUSCULAR
  Filled 2017-10-09: qty 1.6

## 2017-10-09 MED ORDER — HYDROCODONE-ACETAMINOPHEN 5-325 MG PO TABS: 1.0000 | ORAL_TABLET | Freq: Four times a day (QID) | ORAL | 0 refills | Status: AC | PRN

## 2017-10-09 MED ORDER — LIDOCAINE-EPINEPHRINE (PF) 1 %-1:200000 IJ SOLN
INTRAMUSCULAR | Status: DC | PRN
  Administered 2017-10-09: 13 mL

## 2017-10-09 MED ORDER — MIDAZOLAM HCL 2 MG/2ML IJ SOLN
INTRAMUSCULAR | Status: DC | PRN
  Administered 2017-10-09: 2 mg via INTRAVENOUS

## 2017-10-09 MED ORDER — SODIUM CHLORIDE 0.9 % IR SOLN
Status: DC | PRN
  Administered 2017-10-09: 3000 mL

## 2017-10-09 MED ORDER — OXYCODONE HCL 5 MG/5ML PO SOLN: 5.0000 mg | Freq: Once | ORAL | Status: DC | PRN

## 2017-10-09 MED ORDER — MEPERIDINE HCL 25 MG/ML IJ SOLN
6.2500 mg | INTRAMUSCULAR | Status: DC | PRN
  Administered 2017-10-09: 12.5 mg via INTRAVENOUS

## 2017-10-09 MED ORDER — OXYCODONE HCL 5 MG PO TABS: 5.0000 mg | ORAL_TABLET | Freq: Once | ORAL | Status: DC | PRN

## 2017-10-09 NOTE — Anesthesia Postprocedure Evaluation (Signed)
Anesthesia Post Note  Patient: Rachel ForsterMara Lynn Hohler  Procedure(s) Performed: DIAGNOSTIC LAPAROSCOPY WITH REMOVAL OF ECTOPIC PREGNANCY (Right Abdomen)     Patient location during evaluation: PACU Anesthesia Type: General Level of consciousness: awake and alert Pain management: pain level controlled Vital Signs Assessment: post-procedure vital signs reviewed and stable Respiratory status: spontaneous breathing, nonlabored ventilation and respiratory function stable Cardiovascular status: blood pressure returned to baseline and stable Postop Assessment: no apparent nausea or vomiting Anesthetic complications: no    Last Vitals:  Vitals:   10/09/17 0400 10/09/17 0500  BP: 116/69 117/74  Pulse: 87 82  Resp: (!) 21 18  Temp:  36.4 C  SpO2: 99% 99%    Last Pain:  Vitals:   10/09/17 0500  TempSrc:   PainSc: 0-No pain   Pain Goal: Patients Stated Pain Goal: 10 (10/08/17 1952)               Lowella CurbWarren Ray Miller

## 2017-10-09 NOTE — Anesthesia Procedure Notes (Signed)
Procedure Name: Intubation Date/Time: 10/09/2017 12:31 AM Performed by: Sandrea Matte, CRNA Pre-anesthesia Checklist: Patient identified, Emergency Drugs available, Suction available, Patient being monitored and Timeout performed Patient Re-evaluated:Patient Re-evaluated prior to induction Oxygen Delivery Method: Circle system utilized Preoxygenation: Pre-oxygenation with 100% oxygen Induction Type: IV induction, Rapid sequence and Cricoid Pressure applied Laryngoscope Size: Mac and 3 Grade View: Grade I Tube type: Oral Number of attempts: 1 Placement Confirmation: ETT inserted through vocal cords under direct vision,  positive ETCO2 and breath sounds checked- equal and bilateral Secured at: 21 (@ teeth) cm Tube secured with: Tape Dental Injury: Teeth and Oropharynx as per pre-operative assessment

## 2017-10-09 NOTE — Anesthesia Preprocedure Evaluation (Addendum)
Anesthesia Evaluation  Patient identified by MRN, date of birth, ID band Patient awake    Reviewed: Allergy & Precautions, NPO status , Patient's Chart, lab work & pertinent test results  Airway Mallampati: II  TM Distance: >3 FB Neck ROM: Full    Dental no notable dental hx.    Pulmonary neg pulmonary ROS,    Pulmonary exam normal breath sounds clear to auscultation       Cardiovascular negative cardio ROS Normal cardiovascular exam Rhythm:Regular Rate:Normal     Neuro/Psych Anxiety Depression negative neurological ROS  negative psych ROS   GI/Hepatic negative GI ROS, Neg liver ROS,   Endo/Other  negative endocrine ROS  Renal/GU negative Renal ROS  negative genitourinary   Musculoskeletal negative musculoskeletal ROS (+)   Abdominal   Peds negative pediatric ROS (+)  Hematology negative hematology ROS (+)   Anesthesia Other Findings   Reproductive/Obstetrics negative OB ROS                             Anesthesia Physical Anesthesia Plan  ASA: II and emergent  Anesthesia Plan: General   Post-op Pain Management:    Induction: Intravenous, Rapid sequence and Cricoid pressure planned  PONV Risk Score and Plan: 3 and Ondansetron, Dexamethasone and Midazolam  Airway Management Planned: Oral ETT  Additional Equipment:   Intra-op Plan:   Post-operative Plan: Extubation in OR  Informed Consent: I have reviewed the patients History and Physical, chart, labs and discussed the procedure including the risks, benefits and alternatives for the proposed anesthesia with the patient or authorized representative who has indicated his/her understanding and acceptance.   Dental advisory given  Plan Discussed with: CRNA  Anesthesia Plan Comments:         Anesthesia Quick Evaluation

## 2017-10-09 NOTE — Brief Op Note (Signed)
10/09/2017  2:01 AM  PATIENT:  Rachel Benjamin  32 y.o. female  PRE-OPERATIVE DIAGNOSIS:  right ectopic suspected rupture  POST-OPERATIVE DIAGNOSIS:  right ectopic unruptured, leaking from tip of tube Small intramural and serosal fibroids PROCEDURE:  Procedure(s) with comments: DIAGNOSTIC LAPAROSCOPY WITH REMOVAL OF ECTOPIC PREGNANCY (Right) - removal of right linear salpingostomy Removal of ectopic by right linear salpingostomy  SURGEON:  Surgeon(s) and Role:    Tilda Burrow, MD - Primary  PHYSICIAN ASSISTANT:   ASSISTANTS: none   ANESTHESIA:   local and general  EBL:  25 mL   BLOOD ADMINISTERED:none  DRAINS: none   LOCAL MEDICATIONS USED:  MARCAINE    and Amount: 10 ml  SPECIMEN:  Source of Specimen:  Products of conception from ectopic bed  DISPOSITION OF SPECIMEN:  PATHOLOGY  COUNTS:  YES  TOURNIQUET:  * No tourniquets in log *  DICTATION: .Dragon Dictation  PLAN OF CARE: Discharge to home after PACU  PATIENT DISPOSITION:  PACU - hemodynamically stable.   Delay start of Pharmacological VTE agent (>24hrs) due to surgical blood loss or risk of bleeding: not applicable Indications 32 year old female with ultrasound evidence of right ectopic with large distended fallopian tube on that side as per ultrasound found to have a clot in the tube that could be extracted, and a mid tube ectopic that was able to be extracted by linear salpingostomy blood type a positive Details of procedure: Patient was taken to the operating room prepped and draped for combined abdominal and vaginal procedure with Foley catheter in place and cervix grasped with single-tooth tenaculum.  Abdomen prepped and draped in timeout conducted with procedure confirmed by operative team.  An infraumbilical vertical 1 cm skin incision was made as well as a 1.5 cm suprapubic skin incision.  Right lower quadrant site was also prepared.  Veress needle was introduced carefully through the umbilicus being  careful to oriented the needle tip into the pelvis and water droplet technique confirmed satisfactory intraperitoneal location and free flow with insufflation to 2 2 L CO2 followed by insertion of a laparoscopic 5 mm port through the umbilicus which revealed intra-abdominal blood resting in the pelvis.  Suprapubic trocar was inserted under direct visualization, 11 mm trocar, and the right lower quadrant 5 mm trocar inserted under direct visualization being careful to stay away from the inferior epigastric artery and carefully enter due to the patient's small body habitus and tiny BMI Attention was directed to the pelvis where the blood was suctioned out, and the left tube visualized is essentially normal.  The right tube showed distention and clot protruding from the distal portion of the tube.  The clot could be extracted and almost the entire distal half of the tube decompressed when the clot was extracted.  This revealed the ectopic site which was a 1 cm distended area of the tube at midportion to.  This was infiltrated with Pitressin solution a total of 6 cc were used to infiltrate beneath the tube into the bed of the ectopic and in the right IP ligament.  Scissors were used to do a linear salpingostomy of approximately 1 cm in length along the antimesenteric surface of the ectopic.  The grasping device can be placed into the tube itself through this opening rotated and then the ectopic mass grasped and extracted in 1 large piece.  The tube collapsing them further.  The bed of the ectopic was irrigated copiously with saline solution.  Saline was evacuated from the  pelvis.  Specimen was extracted through the suprapubic site using Endo Catch bag. Tube was hemostatic.  Photodocumentation has shown the pre-surgery findings and then the final picture is the tube after removal of the ectopic.  Saline was instilled in the abdomen to assist with evacuation of the carbon dioxide abdomen decompressed, the laparoscopic  trochars removed, the fascia closed suprapubically using 0 Vicryl and on the other 2 sites and the suprapubic site closed subcuticular 4-0 Vicryl with good skin edge approximation with Steri-Strips applied.  Local anesthesia was placed in the incision sites prior to Steri-Strips and cover dressing patient to recovery room in stable condition.  She will receive methotrexate prior to leave leaving and will have quantitative hCG followed in 1 week Blood type Rh+

## 2017-10-09 NOTE — Discharge Instructions (Signed)
Methotrexate Treatment for an Ectopic Pregnancy, Care After Refer to this sheet in the next few weeks. These instructions provide you with information on caring for yourself after your procedure. Your health care provider may also give you more specific instructions. Your treatment has been planned according to current medical practices, but problems sometimes occur. Call your health care provider if you have any problems or questions after your procedure. What can I expect after the procedure? You may have some abdominal cramping, vaginal bleeding, and fatigue in the first few days after taking methotrexate. Some other possible side effects of methotrexate include:  Nausea.  Vomiting.  Diarrhea.  Mouth sores.  Swelling or irritation of the lining of your lungs (pneumonitis).  Liver damage.  Hair loss.  Follow these instructions at home: After you have received the methotrexate medicine, you need to be careful of your activities and watch your condition for several weeks. It may take 1 week before your hormone levels return to normal. Activity  Do not have sexual intercourse until your health care provider says it is safe to do so.  You may resume your usual diet.  Limit strenuous activity.  Do not drink alcohol. General instructions  Do not take aspirin, ibuprofen, or naproxen (nonsteroidal anti-inflammatory drugs [NSAIDs]).  Do not take folic acid, prenatal vitamins, or other vitamins that contain folic acid.  Avoid traveling too far away from your health care provider.  Keep all follow-up visits as told by your health care provider. This is important. Contact a health care provider if:  You cannot control your nausea and vomiting.  You cannot control your diarrhea.  You have sores in your mouth and want treatment.  You need pain medicine for your abdominal pain.  You have a rash.  You are having a reaction to the medicine. Get help right away if:  You have  increasing abdominal or pelvic pain.  You notice increased bleeding.  You feel light-headed, or you faint.  You have shortness of breath.  Your heart rate increases.  You have a cough.  You have chills.  You have a fever. This information is not intended to replace advice given to you by your health care provider. Make sure you discuss any questions you have with your health care provider. Document Released: 05/30/2011 Document Revised: 11/16/2015 Document Reviewed: 03/29/2013 Elsevier Interactive Patient Education  2017 [employer]   Post Anesthesia Home Care Instructions  Activity: Get plenty of rest for the remainder of the day. A responsible individual must stay with you for 24 hours following the procedure.  For the next 24 hours, DO NOT: -Drive a car -Advertising copywriterperate machinery -Drink alcoholic beverages -Take any medication unless instructed by your physician -Make any legal decisions or sign important papers.  Meals: Start with liquid foods such as gelatin or soup. Progress to regular foods as tolerated. Avoid greasy, spicy, heavy foods. If nausea and/or vomiting occur, drink only clear liquids until the nausea and/or vomiting subsides. Call your physician if vomiting continues.  Special Instructions/Symptoms: Your throat may feel dry or sore from the anesthesia or the breathing tube placed in your throat during surgery. If this causes discomfort, gargle with warm salt water. The discomfort should disappear within 24 hours.  If you had a scopolamine patch placed behind your ear for the management of post- operative nausea and/or vomiting:  1. The medication in the patch is effective for 72 hours, after which it should be removed.  Wrap patch in a tissue and  discard in the trash. Wash hands thoroughly with soap and water. 2. You may remove the patch earlier than 72 hours if you experience unpleasant side effects which may include dry mouth, dizziness or visual  disturbances. 3. Avoid touching the patch. Wash your hands with soap and water after contact with the patch  .DISCHARGE INSTRUCTIONS: Laparoscopy  The following instructions have been prepared to help you care for yourself upon your return home today.  Wound care:  Do not get the incision wet for the first 24 hours. The incision should be kept clean and dry.  The Band-Aids or dressings may be removed the day after surgery.  Should the incision become sore, red, and swollen after the first week, check with your doctor.  Personal hygiene:  Shower the day after your procedure.  Activity and limitations:  Do NOT drive or operate any equipment today.  Do NOT lift anything more than 15 pounds for 2-3 weeks after surgery.  Do NOT rest in bed all day.  Walking is encouraged. Walk each day, starting slowly with 5-minute walks 3 or 4 times a day. Slowly increase the length of your walks.  Walk up and down stairs slowly.  Do NOT do strenuous activities, such as golfing, playing tennis, bowling, running, biking, weight lifting, gardening, mowing, or vacuuming for 2-4 weeks. Ask your doctor when it is okay to start.  Diet: Eat a light meal as desired this evening. You may resume your usual diet tomorrow.  Return to work: This is dependent on the type of work you do. For the most part you can return to a desk job within a week of surgery. If you are more active at work, please discuss this with your doctor.  What to expect after your surgery: You may have a slight burning sensation when you urinate on the first day. You may have a very small amount of blood in the urine. Expect to have a small amount of vaginal discharge/light bleeding for 1-2 weeks. It is not unusual to have abdominal soreness and bruising for up to 2 weeks. You may be tired and need more rest for about 1 week. You may experience shoulder pain for 24-72 hours. Lying flat in bed may relieve it.  Call your doctor for any of  the following:  Develop a fever of 100.4 or greater  Inability to urinate 6 hours after discharge from hospital  Severe pain not relieved by pain medications  Persistent of heavy bleeding at incision site  Redness or swelling around incision site after a week  Increasing nausea or vomiting

## 2017-10-09 NOTE — Op Note (Signed)
Please see the brief operative note for surgical details 

## 2017-10-09 NOTE — Transfer of Care (Signed)
Immediate Anesthesia Transfer of Care Note  Patient: Rachel ForsterMara Lynn Benjamin  Procedure(s) Performed: DIAGNOSTIC LAPAROSCOPY WITH REMOVAL OF ECTOPIC PREGNANCY (Right Abdomen)  Patient Location: PACU  Anesthesia Type:General  Level of Consciousness: awake, alert , oriented and patient cooperative  Airway & Oxygen Therapy: Patient Spontanous Breathing  Post-op Assessment: Report given to RN and Post -op Vital signs reviewed and stable  Post vital signs: Reviewed and stable  Last Vitals:  Vitals Value Taken Time  BP    Temp    Pulse    Resp    SpO2      Last Pain:  Vitals:   10/08/17 1952  TempSrc:   PainSc: 2       Patients Stated Pain Goal: 10 (10/08/17 1952)  Complications: No apparent anesthesia complications

## 2017-10-09 NOTE — Addendum Note (Signed)
Addendum  created 10/09/17 0948 by Earmon PhoenixWilkerson, Corley Kohls P, CRNA   Intraprocedure Event edited

## 2017-10-10 ENCOUNTER — Telehealth: Payer: Self-pay | Admitting: Obstetrics and Gynecology

## 2017-10-10 ENCOUNTER — Other Ambulatory Visit: Payer: Self-pay | Admitting: Obstetrics and Gynecology

## 2017-10-10 MED ORDER — SULFAMETHOXAZOLE-TRIMETHOPRIM 400-80 MG PO TABS: 1.0000 | ORAL_TABLET | Freq: Two times a day (BID) | ORAL | 0 refills | Status: AC

## 2017-10-10 NOTE — Telephone Encounter (Signed)
-----   Message from Tilda BurrowJohn Kacee Koren V, MD sent at 10/09/2017  2:00 AM EDT ----- Regarding: followup ectopic surgery Tube saved, so needs q hcg weekly til goes to zero.

## 2017-10-11 LAB — CULTURE, OB URINE

## 2017-10-12 ENCOUNTER — Telehealth: Payer: Self-pay | Admitting: Student

## 2017-10-12 NOTE — Telephone Encounter (Signed)
Left vm for patient and told her she had UTI and needed to pick up RX. Gave her number for [facility] and advised her to call back and let us know she is getting her medications. Will call her back again this evening.   Rachel KitchensKathryn Tyrik Stetzer

## 2017-10-13 ENCOUNTER — Telehealth: Payer: Self-pay | Admitting: *Deleted

## 2017-10-13 NOTE — Telephone Encounter (Signed)
Left message for pt to pick up Rx for antibiotic.

## 2017-10-13 NOTE — Telephone Encounter (Signed)
LMOVM to return call regarding urine culture.

## 2017-10-13 NOTE — Telephone Encounter (Signed)
LMOVM returning patient's call.  

## 2017-10-16 ENCOUNTER — Ambulatory Visit: Payer: 59

## 2017-10-20 ENCOUNTER — Other Ambulatory Visit: Payer: Self-pay

## 2017-10-20 DIAGNOSIS — O009 Unspecified ectopic pregnancy without intrauterine pregnancy: Secondary | ICD-10-CM

## 2017-10-21 ENCOUNTER — Other Ambulatory Visit: Payer: Self-pay

## 2017-10-21 ENCOUNTER — Encounter: Payer: Self-pay | Admitting: General Practice

## 2017-10-21 DIAGNOSIS — O009 Unspecified ectopic pregnancy without intrauterine pregnancy: Secondary | ICD-10-CM

## 2017-10-22 LAB — BETA HCG QUANT (REF LAB): hCG Quant: 11 m[IU]/mL

## 2017-11-03 ENCOUNTER — Encounter: Payer: Self-pay | Admitting: *Deleted

## 2017-11-03 ENCOUNTER — Telehealth: Payer: Self-pay | Admitting: *Deleted

## 2017-11-03 NOTE — Telephone Encounter (Signed)
Pt left message on 5/9 @ 1523 stating that she has not received a call from anyone stating that she needs another lab draw for HCG. I called pt back and left message stating that I will send a message via MyChart. Per chart review, pt's BHCG from 4/30 was reviewed by Dr. Emelda Fear on 5/1. Recommendation made for pt to have repeat HCG done in 1 week however it does not appear that a message was sent to the pt or clinical staff with this recommendation.

## 2017-11-06 ENCOUNTER — Other Ambulatory Visit: Payer: Self-pay | Admitting: General Practice

## 2017-11-06 DIAGNOSIS — O009 Unspecified ectopic pregnancy without intrauterine pregnancy: Secondary | ICD-10-CM

## 2017-11-10 ENCOUNTER — Other Ambulatory Visit: Payer: Self-pay

## 2017-11-10 DIAGNOSIS — O009 Unspecified ectopic pregnancy without intrauterine pregnancy: Secondary | ICD-10-CM

## 2017-11-11 LAB — BETA HCG QUANT (REF LAB): hCG Quant: <1 m[IU]/mL

## 2018-12-23 IMAGING — US US OB TRANSVAGINAL
1 series · 15 of 28 positions shown · non-contrast
Comparison: None.

CLINICAL DATA: Vaginal bleeding. Gestational age by last menstrual
period 1 week and 4 days. Beta HCG [DATE].

EXAM:
OBSTETRIC <14 WK US AND TRANSVAGINAL OB US
TECHNIQUE: Both transabdominal and transvaginal ultrasound examinations were
performed for complete evaluation of the gestation as well as the
maternal uterus, adnexal regions, and pelvic cul-de-sac.
Transvaginal technique was performed to assess early pregnancy.

[Series 1: us ob transvaginal · 15 of 74 slices shown]
[im 1/74]
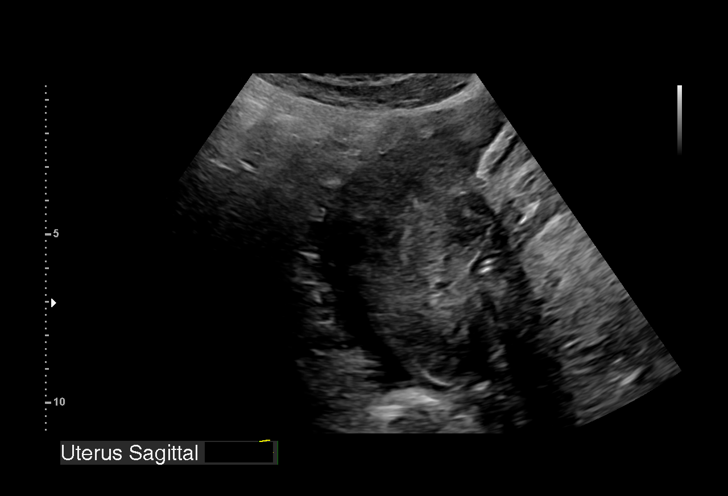
[im 6/74]
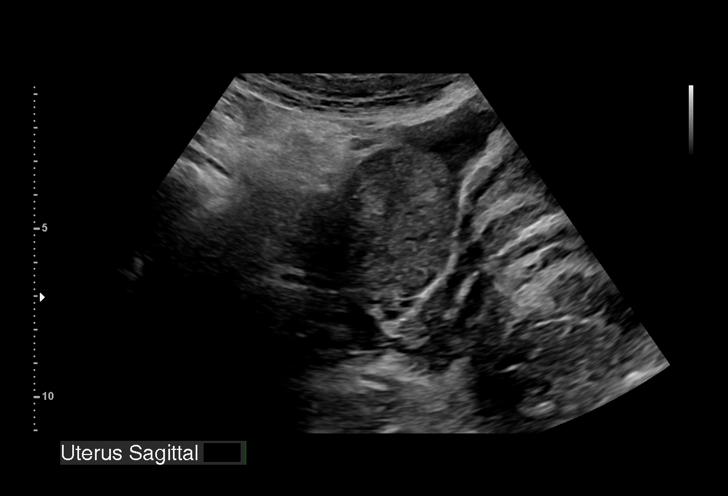
[im 11/74]
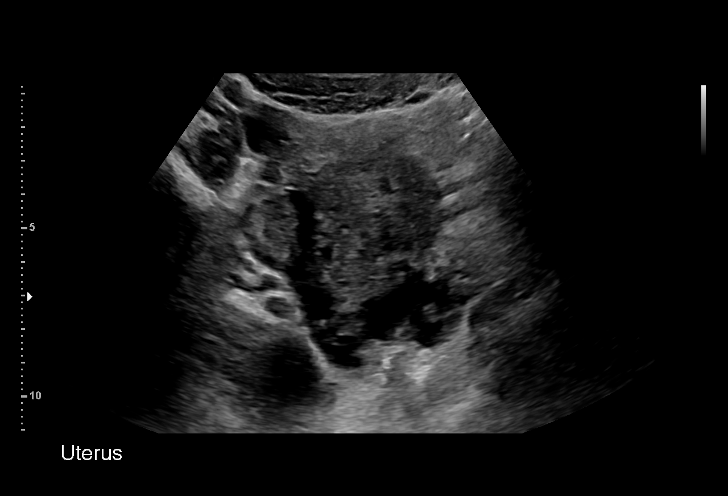
[im 17/74]
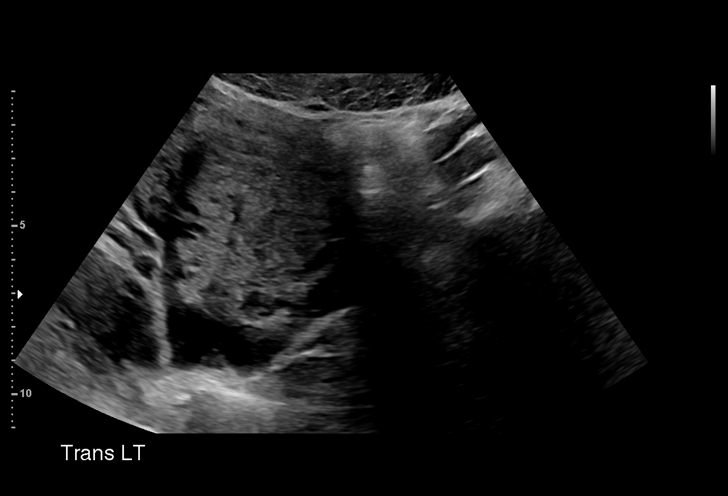
[im 22/74]
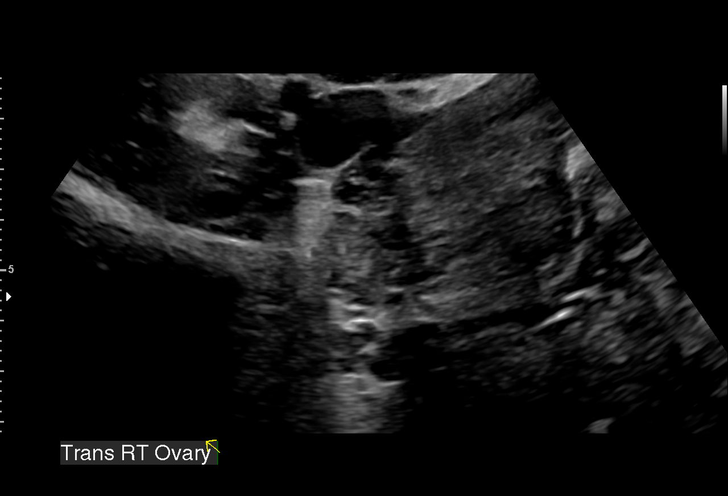
[im 28/74]
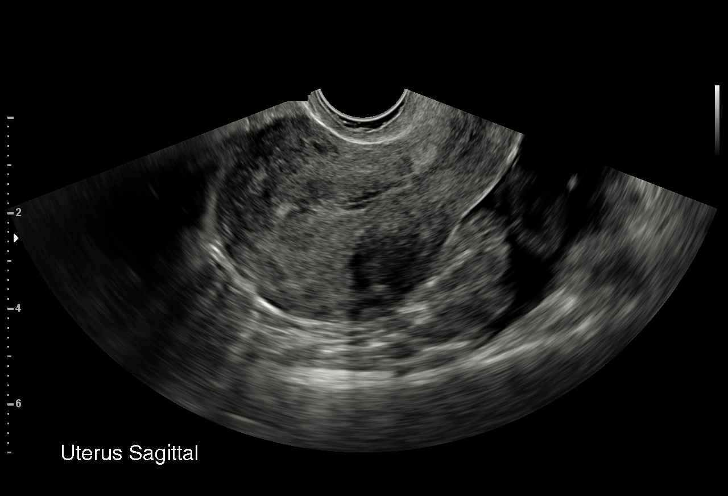
[im 33/74]
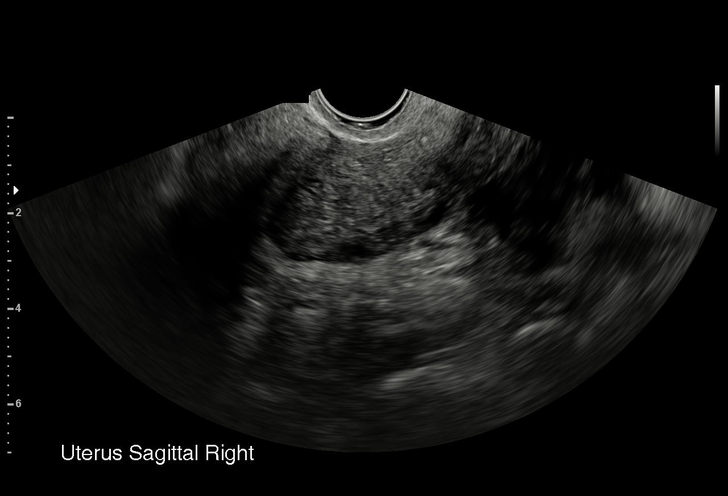
[im 38/74]
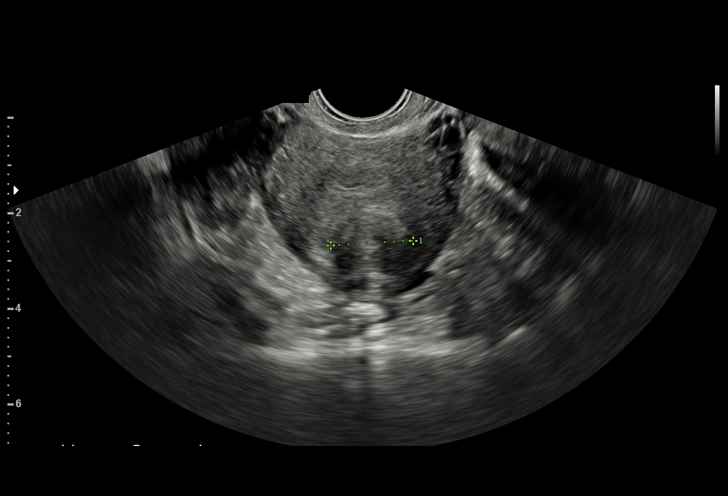
[im 41/74]
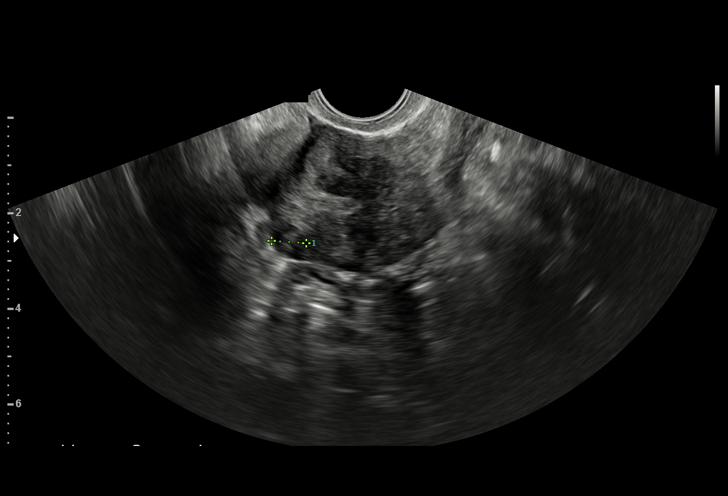
[im 46/74]
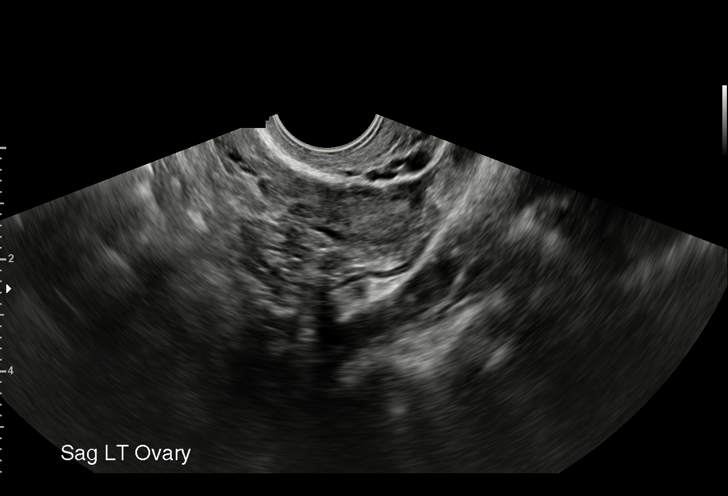
[im 52/74]
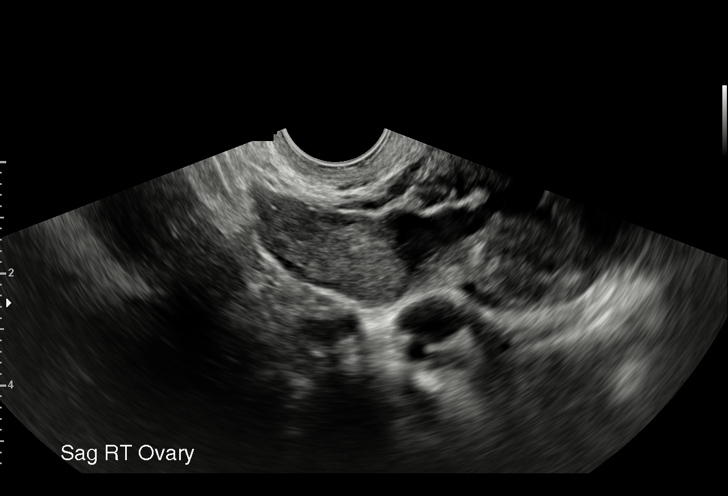
[im 57/74]
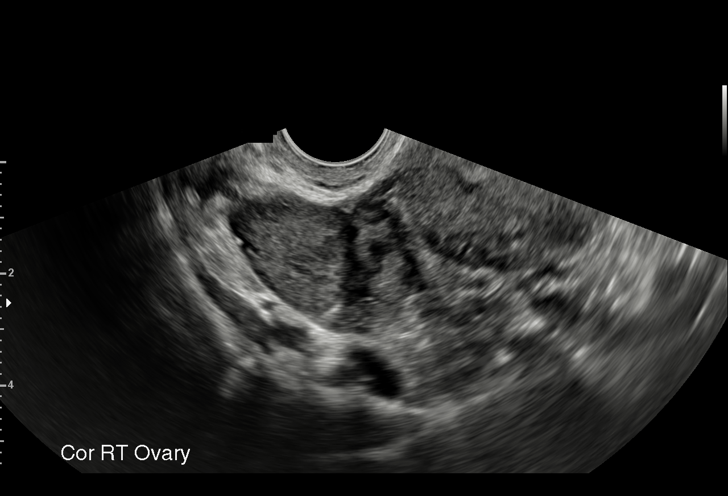
[im 63/74]
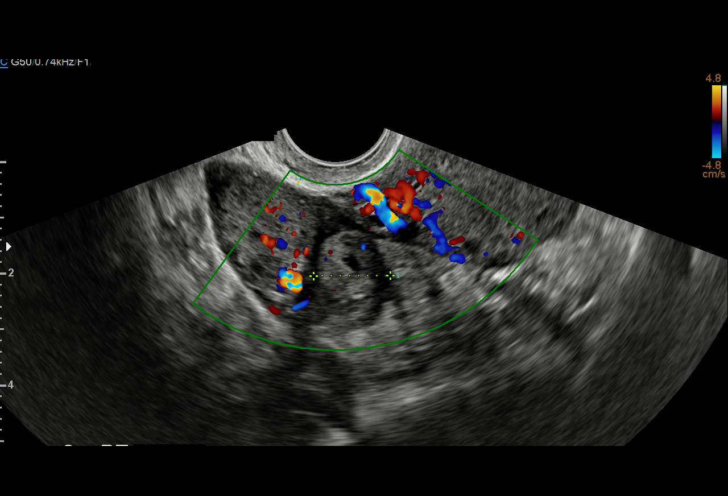
[im 68/74]
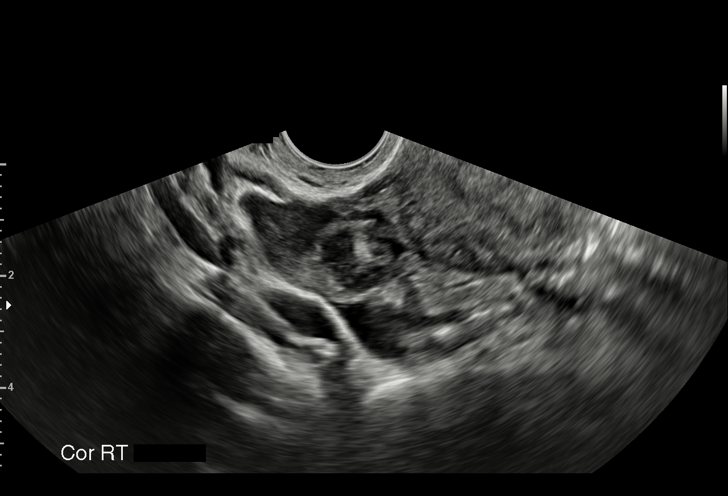
[im 74/74]
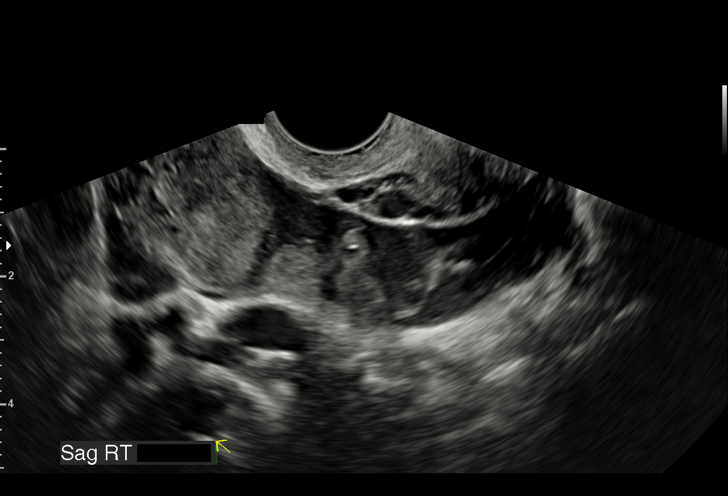

[15 of 28 positions shown; findings below may reference images not displayed]

FINDINGS: Intrauterine gestational sac: Not present

Yolk sac:  Not present

Embryo:  Not present

Subchorionic hemorrhage:  None visualized.

Maternal uterus/adnexae: Multiple intramural measuring to 19 mm.
Moderate amount of complex free fluid in the pelvis. RIGHT para
ovarian tubular thick-walled structure measuring to 14 mm with
central anechoic component, not particularly vascular. Within or
adjacent is a 1.3 cm thick-walled cystic structure with anechoic
central component.
IMPRESSION: 1. No sonographically identified IUP. Please note, with beta HCG of
less than [DATE], IUP may not be sonographically evident.
2. RIGHT lower quadrant suspected ectopic pregnancy. Adjacent
irregular tubal structure consistent with complex fluid, less likely
appendicitis or pyosalpinx.
3. Moderate free fluid in the pelvis.
4. Acute findings discussed with and reconfirmed by [place] Surya,
NP on 10/08/2017 at [DATE].
